# Patient Record
Sex: Male | Born: 1970 | Race: White | Hispanic: No | Marital: Married | Smoking: Current every day smoker
Health system: Southern US, Community
[De-identification: ages and names within clinical notes are randomized; demographics above are authoritative.]

## PROBLEM LIST (undated history)

## (undated) DIAGNOSIS — I219 Acute myocardial infarction, unspecified: Secondary | ICD-10-CM

## (undated) DIAGNOSIS — E119 Type 2 diabetes mellitus without complications: Secondary | ICD-10-CM

## (undated) DIAGNOSIS — R569 Unspecified convulsions: Secondary | ICD-10-CM

## (undated) HISTORY — PX: CARDIAC SURGERY: SHX584

---

## 1998-03-08 ENCOUNTER — Emergency Department (HOSPITAL_COMMUNITY): Admission: EM | Admit: 1998-03-08 | Discharge: 1998-03-08 | Payer: Self-pay | Admitting: Emergency Medicine

## 1998-05-12 ENCOUNTER — Emergency Department (HOSPITAL_COMMUNITY): Admission: EM | Admit: 1998-05-12 | Discharge: 1998-05-12 | Payer: Self-pay | Admitting: Emergency Medicine

## 1998-08-30 ENCOUNTER — Encounter (HOSPITAL_COMMUNITY): Admission: RE | Admit: 1998-08-30 | Discharge: 1998-11-28 | Payer: Self-pay | Admitting: Neurology

## 1998-11-27 ENCOUNTER — Emergency Department (HOSPITAL_COMMUNITY): Admission: EM | Admit: 1998-11-27 | Discharge: 1998-11-27 | Payer: Self-pay | Admitting: Emergency Medicine

## 1998-12-14 ENCOUNTER — Encounter: Payer: Self-pay | Admitting: Endocrinology

## 1998-12-14 ENCOUNTER — Ambulatory Visit (HOSPITAL_COMMUNITY): Admission: RE | Admit: 1998-12-14 | Discharge: 1998-12-14 | Payer: Self-pay | Admitting: Endocrinology

## 1999-04-09 ENCOUNTER — Emergency Department (HOSPITAL_COMMUNITY): Admission: EM | Admit: 1999-04-09 | Discharge: 1999-04-09 | Payer: Self-pay

## 1999-07-29 ENCOUNTER — Encounter (INDEPENDENT_AMBULATORY_CARE_PROVIDER_SITE_OTHER): Payer: Self-pay | Admitting: *Deleted

## 1999-07-29 ENCOUNTER — Inpatient Hospital Stay (HOSPITAL_COMMUNITY): Admission: EM | Admit: 1999-07-29 | Discharge: 1999-07-31 | Payer: Self-pay | Admitting: *Deleted

## 1999-09-07 ENCOUNTER — Encounter: Admission: RE | Admit: 1999-09-07 | Discharge: 1999-09-07 | Payer: Self-pay | Admitting: Infectious Diseases

## 2000-03-19 ENCOUNTER — Encounter (INDEPENDENT_AMBULATORY_CARE_PROVIDER_SITE_OTHER): Payer: Self-pay | Admitting: Specialist

## 2000-03-19 ENCOUNTER — Ambulatory Visit (HOSPITAL_COMMUNITY): Admission: RE | Admit: 2000-03-19 | Discharge: 2000-03-19 | Payer: Self-pay | Admitting: Gastroenterology

## 2000-05-26 ENCOUNTER — Encounter: Payer: Self-pay | Admitting: Emergency Medicine

## 2000-05-27 ENCOUNTER — Inpatient Hospital Stay (HOSPITAL_COMMUNITY): Admission: EM | Admit: 2000-05-27 | Discharge: 2000-06-02 | Payer: Self-pay | Admitting: Emergency Medicine

## 2000-05-27 ENCOUNTER — Encounter: Payer: Self-pay | Admitting: Pulmonary Disease

## 2000-05-28 ENCOUNTER — Encounter: Payer: Self-pay | Admitting: Neurology

## 2000-05-29 ENCOUNTER — Encounter: Payer: Self-pay | Admitting: Neurology

## 2000-08-14 ENCOUNTER — Ambulatory Visit (HOSPITAL_COMMUNITY): Admission: RE | Admit: 2000-08-14 | Discharge: 2000-08-14 | Payer: Self-pay | Admitting: Family Medicine

## 2000-08-14 ENCOUNTER — Encounter: Payer: Self-pay | Admitting: Family Medicine

## 2000-10-13 ENCOUNTER — Encounter: Payer: Self-pay | Admitting: Emergency Medicine

## 2000-10-13 ENCOUNTER — Emergency Department (HOSPITAL_COMMUNITY): Admission: EM | Admit: 2000-10-13 | Discharge: 2000-10-13 | Payer: Self-pay | Admitting: Emergency Medicine

## 2000-12-09 ENCOUNTER — Emergency Department (HOSPITAL_COMMUNITY): Admission: EM | Admit: 2000-12-09 | Discharge: 2000-12-09 | Payer: Self-pay | Admitting: Emergency Medicine

## 2000-12-09 ENCOUNTER — Encounter: Payer: Self-pay | Admitting: Emergency Medicine

## 2001-03-27 ENCOUNTER — Emergency Department (HOSPITAL_COMMUNITY): Admission: EM | Admit: 2001-03-27 | Discharge: 2001-03-27 | Payer: Self-pay | Admitting: Emergency Medicine

## 2001-03-27 ENCOUNTER — Encounter: Payer: Self-pay | Admitting: Emergency Medicine

## 2001-07-05 ENCOUNTER — Encounter: Payer: Self-pay | Admitting: Emergency Medicine

## 2001-07-05 ENCOUNTER — Emergency Department (HOSPITAL_COMMUNITY): Admission: EM | Admit: 2001-07-05 | Discharge: 2001-07-05 | Payer: Self-pay | Admitting: Emergency Medicine

## 2001-07-14 ENCOUNTER — Emergency Department (HOSPITAL_COMMUNITY): Admission: EM | Admit: 2001-07-14 | Discharge: 2001-07-14 | Payer: Self-pay

## 2001-10-04 ENCOUNTER — Emergency Department (HOSPITAL_COMMUNITY): Admission: EM | Admit: 2001-10-04 | Discharge: 2001-10-04 | Payer: Self-pay | Admitting: Emergency Medicine

## 2001-10-04 ENCOUNTER — Encounter: Payer: Self-pay | Admitting: Emergency Medicine

## 2002-01-09 ENCOUNTER — Encounter: Payer: Self-pay | Admitting: Emergency Medicine

## 2002-01-09 ENCOUNTER — Emergency Department (HOSPITAL_COMMUNITY): Admission: EM | Admit: 2002-01-09 | Discharge: 2002-01-09 | Payer: Self-pay | Admitting: Emergency Medicine

## 2002-11-27 ENCOUNTER — Emergency Department (HOSPITAL_COMMUNITY): Admission: EM | Admit: 2002-11-27 | Discharge: 2002-11-27 | Payer: Self-pay | Admitting: Emergency Medicine

## 2002-11-27 ENCOUNTER — Encounter: Payer: Self-pay | Admitting: Emergency Medicine

## 2003-05-08 ENCOUNTER — Emergency Department (HOSPITAL_COMMUNITY): Admission: EM | Admit: 2003-05-08 | Discharge: 2003-05-08 | Payer: Self-pay | Admitting: Emergency Medicine

## 2003-10-17 ENCOUNTER — Emergency Department (HOSPITAL_COMMUNITY): Admission: EM | Admit: 2003-10-17 | Discharge: 2003-10-17 | Payer: Self-pay

## 2003-11-20 ENCOUNTER — Emergency Department (HOSPITAL_COMMUNITY): Admission: EM | Admit: 2003-11-20 | Discharge: 2003-11-20 | Payer: Self-pay | Admitting: Emergency Medicine

## 2004-01-17 ENCOUNTER — Emergency Department (HOSPITAL_COMMUNITY): Admission: EM | Admit: 2004-01-17 | Discharge: 2004-01-17 | Payer: Self-pay | Admitting: Emergency Medicine

## 2004-01-25 ENCOUNTER — Emergency Department (HOSPITAL_COMMUNITY): Admission: EM | Admit: 2004-01-25 | Discharge: 2004-01-25 | Payer: Self-pay | Admitting: Family Medicine

## 2004-02-10 ENCOUNTER — Emergency Department (HOSPITAL_COMMUNITY): Admission: EM | Admit: 2004-02-10 | Discharge: 2004-02-10 | Payer: Self-pay | Admitting: Family Medicine

## 2004-04-12 ENCOUNTER — Encounter: Admission: RE | Admit: 2004-04-12 | Discharge: 2004-04-12 | Payer: Self-pay | Admitting: Family Medicine

## 2004-04-12 ENCOUNTER — Emergency Department (HOSPITAL_COMMUNITY): Admission: EM | Admit: 2004-04-12 | Discharge: 2004-04-12 | Payer: Self-pay | Admitting: Emergency Medicine

## 2004-07-10 ENCOUNTER — Emergency Department (HOSPITAL_COMMUNITY): Admission: EM | Admit: 2004-07-10 | Discharge: 2004-07-10 | Payer: Self-pay | Admitting: Family Medicine

## 2004-08-23 ENCOUNTER — Emergency Department (HOSPITAL_COMMUNITY): Admission: EM | Admit: 2004-08-23 | Discharge: 2004-08-23 | Payer: Self-pay | Admitting: Family Medicine

## 2004-12-09 ENCOUNTER — Emergency Department (HOSPITAL_COMMUNITY): Admission: EM | Admit: 2004-12-09 | Discharge: 2004-12-09 | Payer: Self-pay | Admitting: Emergency Medicine

## 2004-12-25 ENCOUNTER — Emergency Department (HOSPITAL_COMMUNITY): Admission: EM | Admit: 2004-12-25 | Discharge: 2004-12-25 | Payer: Self-pay | Admitting: Family Medicine

## 2005-01-21 ENCOUNTER — Encounter: Admission: RE | Admit: 2005-01-21 | Discharge: 2005-01-21 | Payer: Self-pay | Admitting: Family Medicine

## 2005-02-11 ENCOUNTER — Emergency Department (HOSPITAL_COMMUNITY): Admission: EM | Admit: 2005-02-11 | Discharge: 2005-02-11 | Payer: Self-pay | Admitting: Emergency Medicine

## 2005-02-22 ENCOUNTER — Emergency Department: Payer: Self-pay | Admitting: Emergency Medicine

## 2005-03-02 ENCOUNTER — Emergency Department (HOSPITAL_COMMUNITY): Admission: EM | Admit: 2005-03-02 | Discharge: 2005-03-02 | Payer: Self-pay | Admitting: Emergency Medicine

## 2005-03-14 ENCOUNTER — Emergency Department (HOSPITAL_COMMUNITY): Admission: EM | Admit: 2005-03-14 | Discharge: 2005-03-15 | Payer: Self-pay | Admitting: Emergency Medicine

## 2005-04-16 ENCOUNTER — Emergency Department: Payer: Self-pay | Admitting: Unknown Physician Specialty

## 2005-06-10 ENCOUNTER — Emergency Department: Payer: Self-pay | Admitting: General Practice

## 2005-06-11 ENCOUNTER — Emergency Department (HOSPITAL_COMMUNITY): Admission: EM | Admit: 2005-06-11 | Discharge: 2005-06-11 | Payer: Self-pay | Admitting: Emergency Medicine

## 2005-08-10 ENCOUNTER — Emergency Department: Payer: Self-pay | Admitting: Emergency Medicine

## 2005-09-22 ENCOUNTER — Encounter: Admission: RE | Admit: 2005-09-22 | Discharge: 2005-09-22 | Payer: Self-pay | Admitting: *Deleted

## 2005-10-27 ENCOUNTER — Emergency Department (HOSPITAL_COMMUNITY): Admission: EM | Admit: 2005-10-27 | Discharge: 2005-10-27 | Payer: Self-pay | Admitting: Emergency Medicine

## 2005-12-03 ENCOUNTER — Ambulatory Visit: Payer: Self-pay | Admitting: Family Medicine

## 2005-12-16 IMAGING — CR DG CHEST 2V
1 series · 2 of 2 positions shown · non-contrast
Comparison: none

REASON FOR EXAM: Cough
COMMENTS:  LMP: (Male)

PROCEDURE:     DXR - DXR CHEST PA (OR AP) AND LATERAL  - April 16, 2005  [DATE]
RESULT:     The inspiratory effort is somewhat shallow.  The lungs are
clear.  The heart and pulmonary vessels are normal.

[Series 2134: postero_anterior · 0.11mm/px · 2 of 2 slices shown]
[im 1/2]
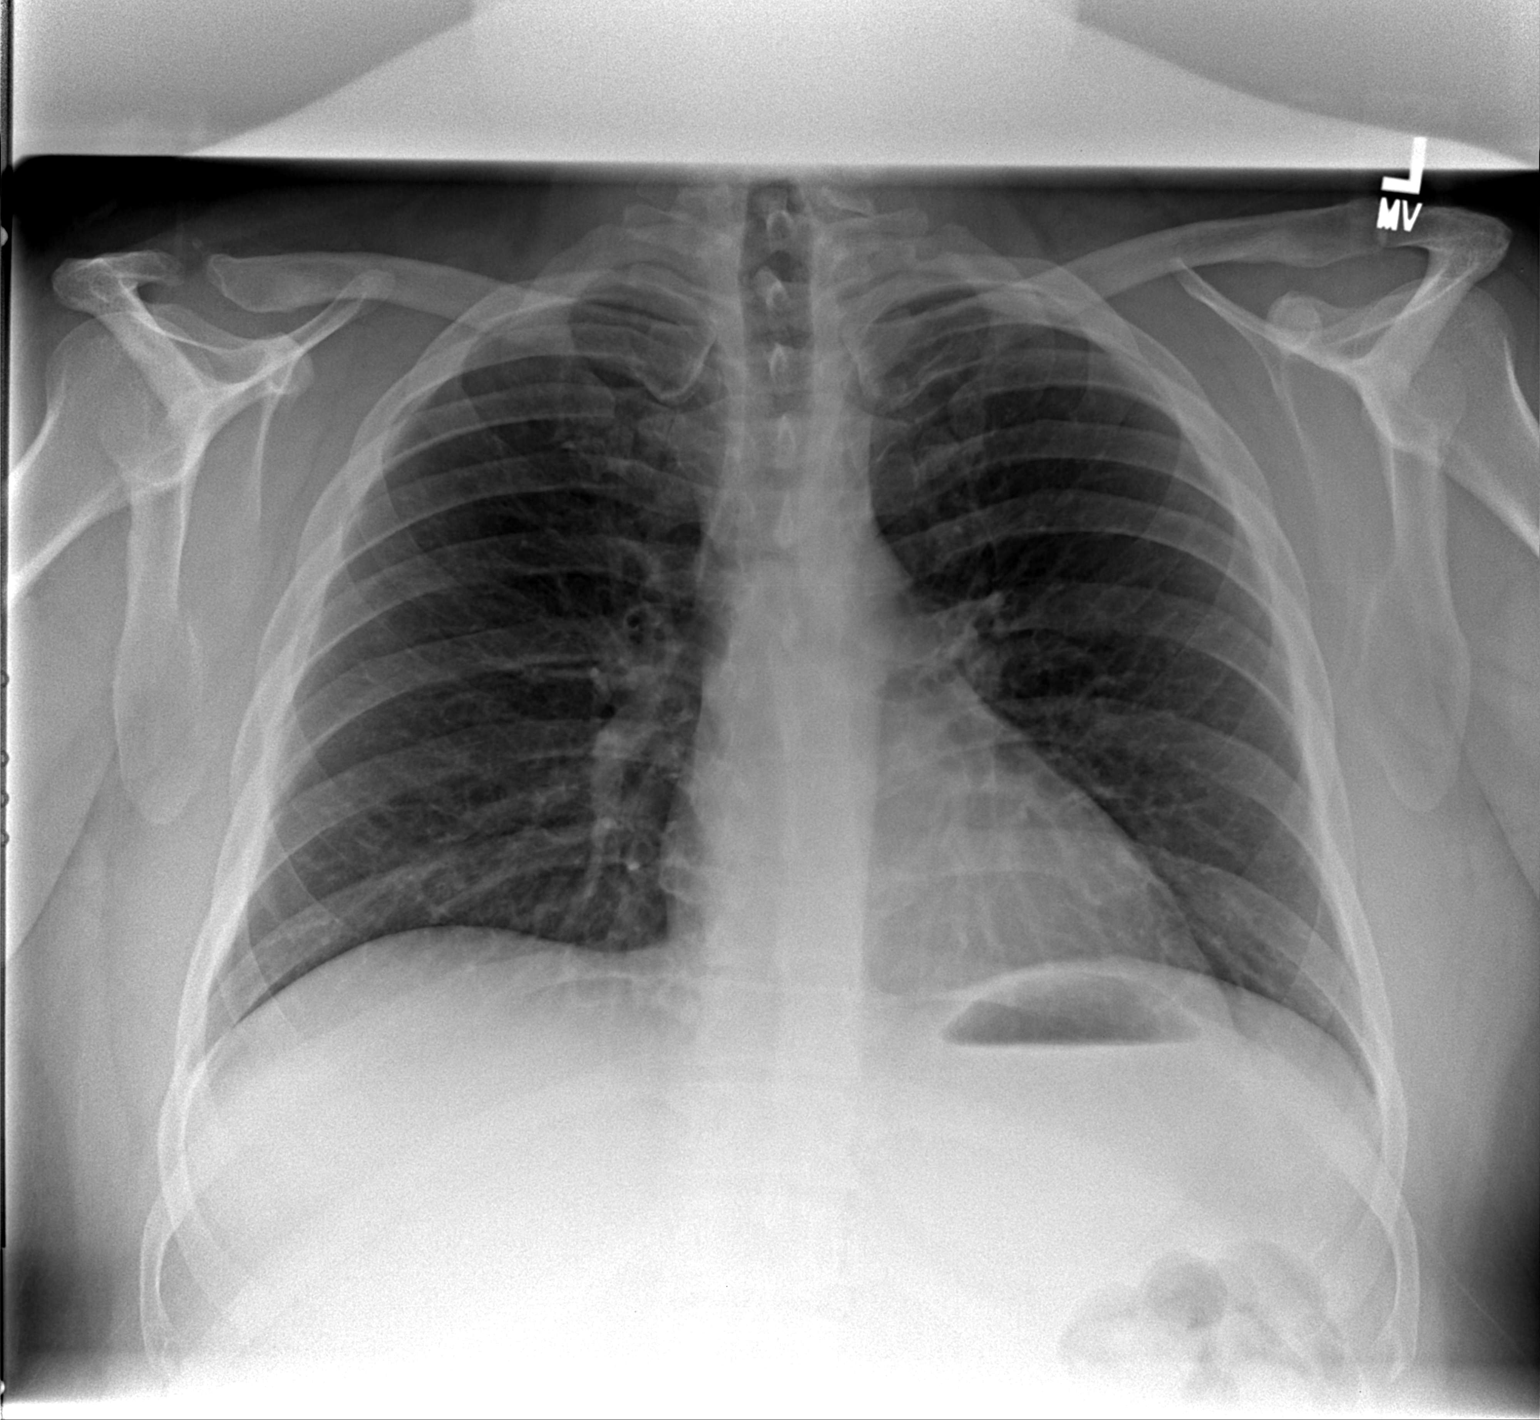
[im 2/2]
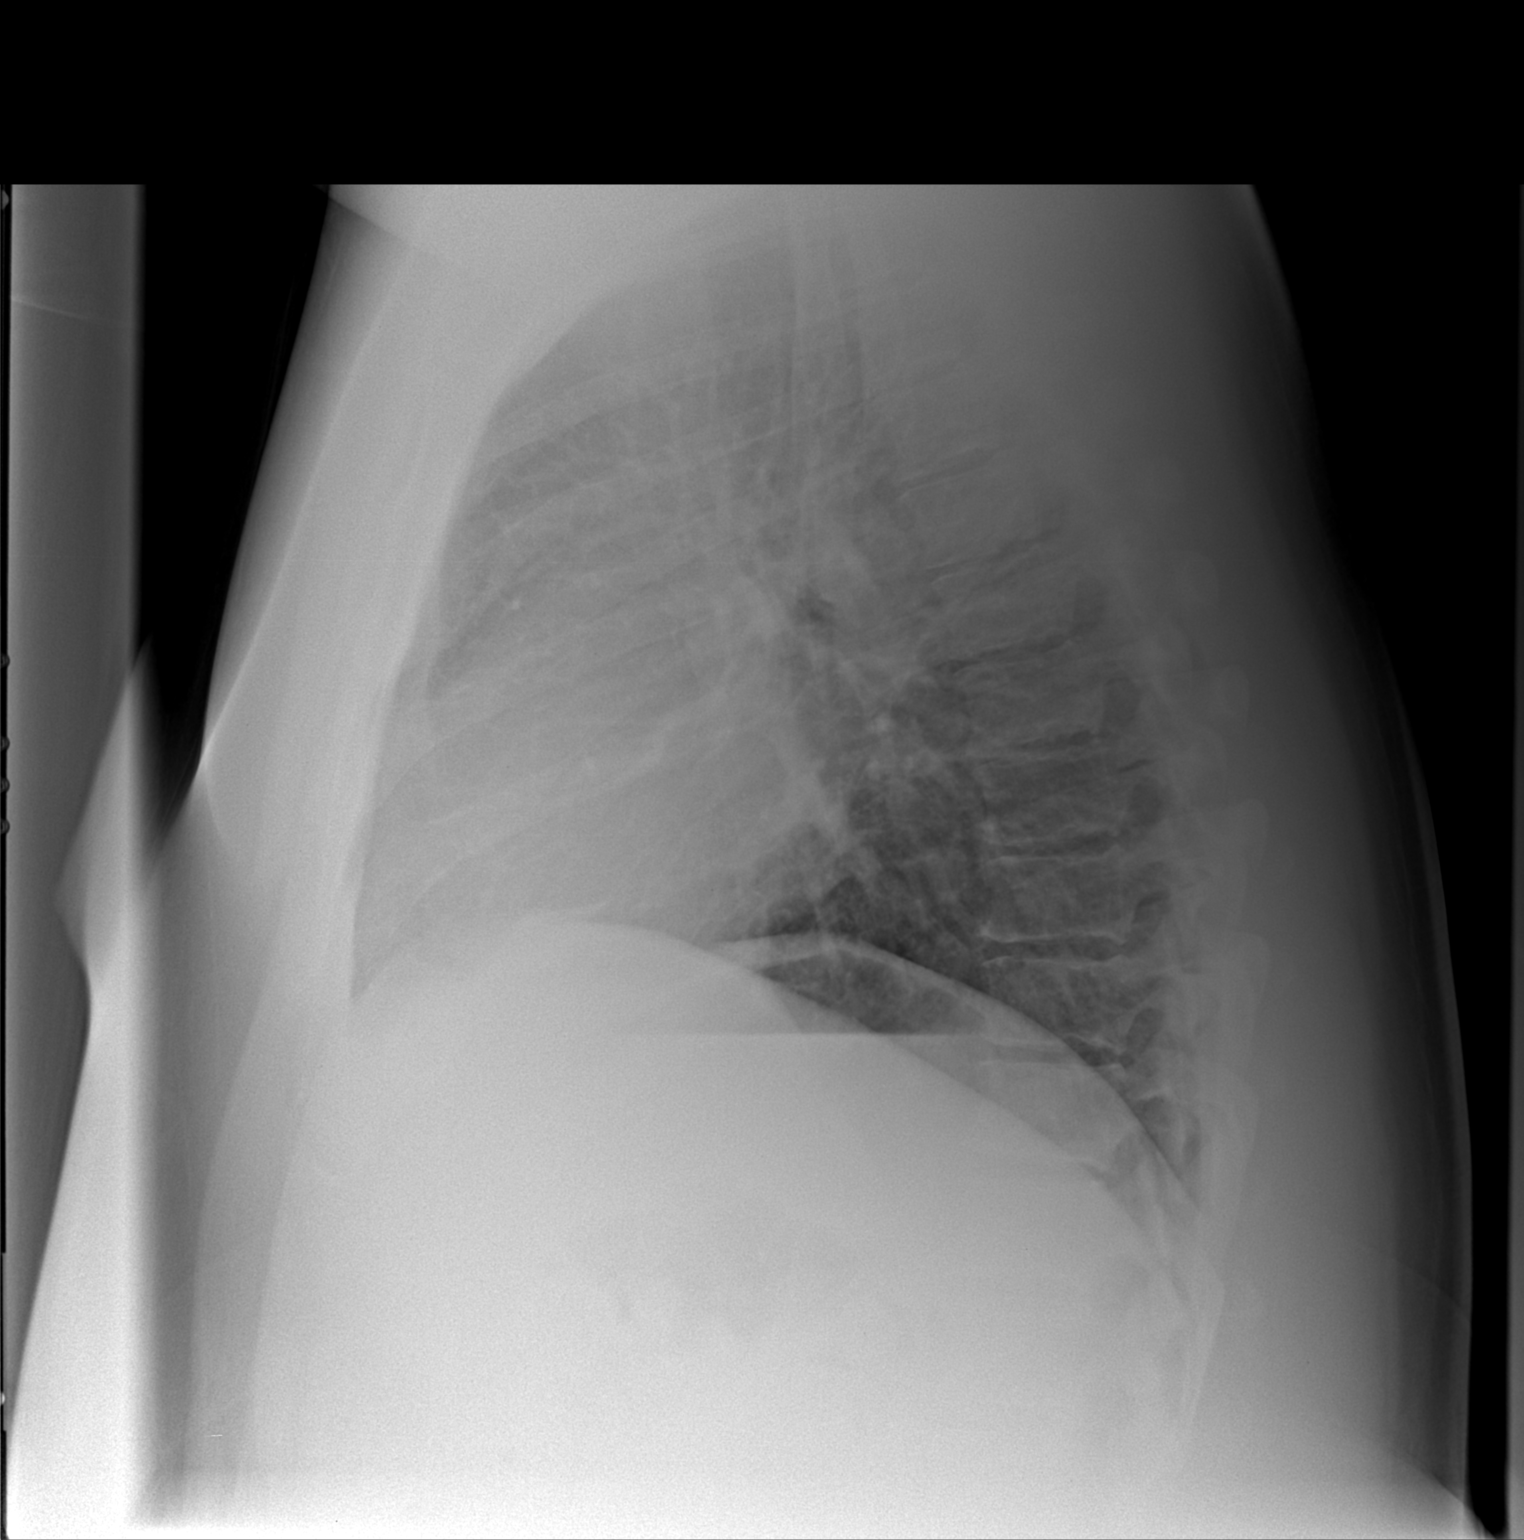

[2 of 2 positions shown; findings below may reference images not displayed]

IMPRESSION: No acute abnormality.

Shallow inspiration.

## 2006-01-19 ENCOUNTER — Emergency Department (HOSPITAL_COMMUNITY): Admission: EM | Admit: 2006-01-19 | Discharge: 2006-01-19 | Payer: Self-pay | Admitting: Emergency Medicine

## 2006-02-02 ENCOUNTER — Emergency Department: Payer: Self-pay | Admitting: Emergency Medicine

## 2006-05-16 ENCOUNTER — Ambulatory Visit: Payer: Self-pay | Admitting: Family Medicine

## 2006-06-12 ENCOUNTER — Encounter: Admission: RE | Admit: 2006-06-12 | Discharge: 2006-06-12 | Payer: Self-pay | Admitting: Family Medicine

## 2006-07-01 ENCOUNTER — Ambulatory Visit: Payer: Self-pay | Admitting: Family Medicine

## 2006-08-10 ENCOUNTER — Emergency Department: Payer: Self-pay | Admitting: Emergency Medicine

## 2006-08-28 ENCOUNTER — Emergency Department (HOSPITAL_COMMUNITY): Admission: EM | Admit: 2006-08-28 | Discharge: 2006-08-28 | Payer: Self-pay | Admitting: Emergency Medicine

## 2006-10-04 IMAGING — CR DG CHEST 2V
1 series · 2 of 2 positions shown · non-contrast
Comparison: none

REASON FOR EXAM: COUGH
COMMENTS:

PROCEDURE:     DXR - DXR CHEST PA (OR AP) AND LATERAL  - February 03, 2006 [DATE]
RESULT:     Comparison is made to the study of 04-26-05.  The lungs are
clear. The heart and pulmonary vessels are normal. The bony and mediastinal
structures are unremarkable.

[Series 1: view not recorded · 0.17mm/px · 2 of 2 slices shown]
[im 1/2]
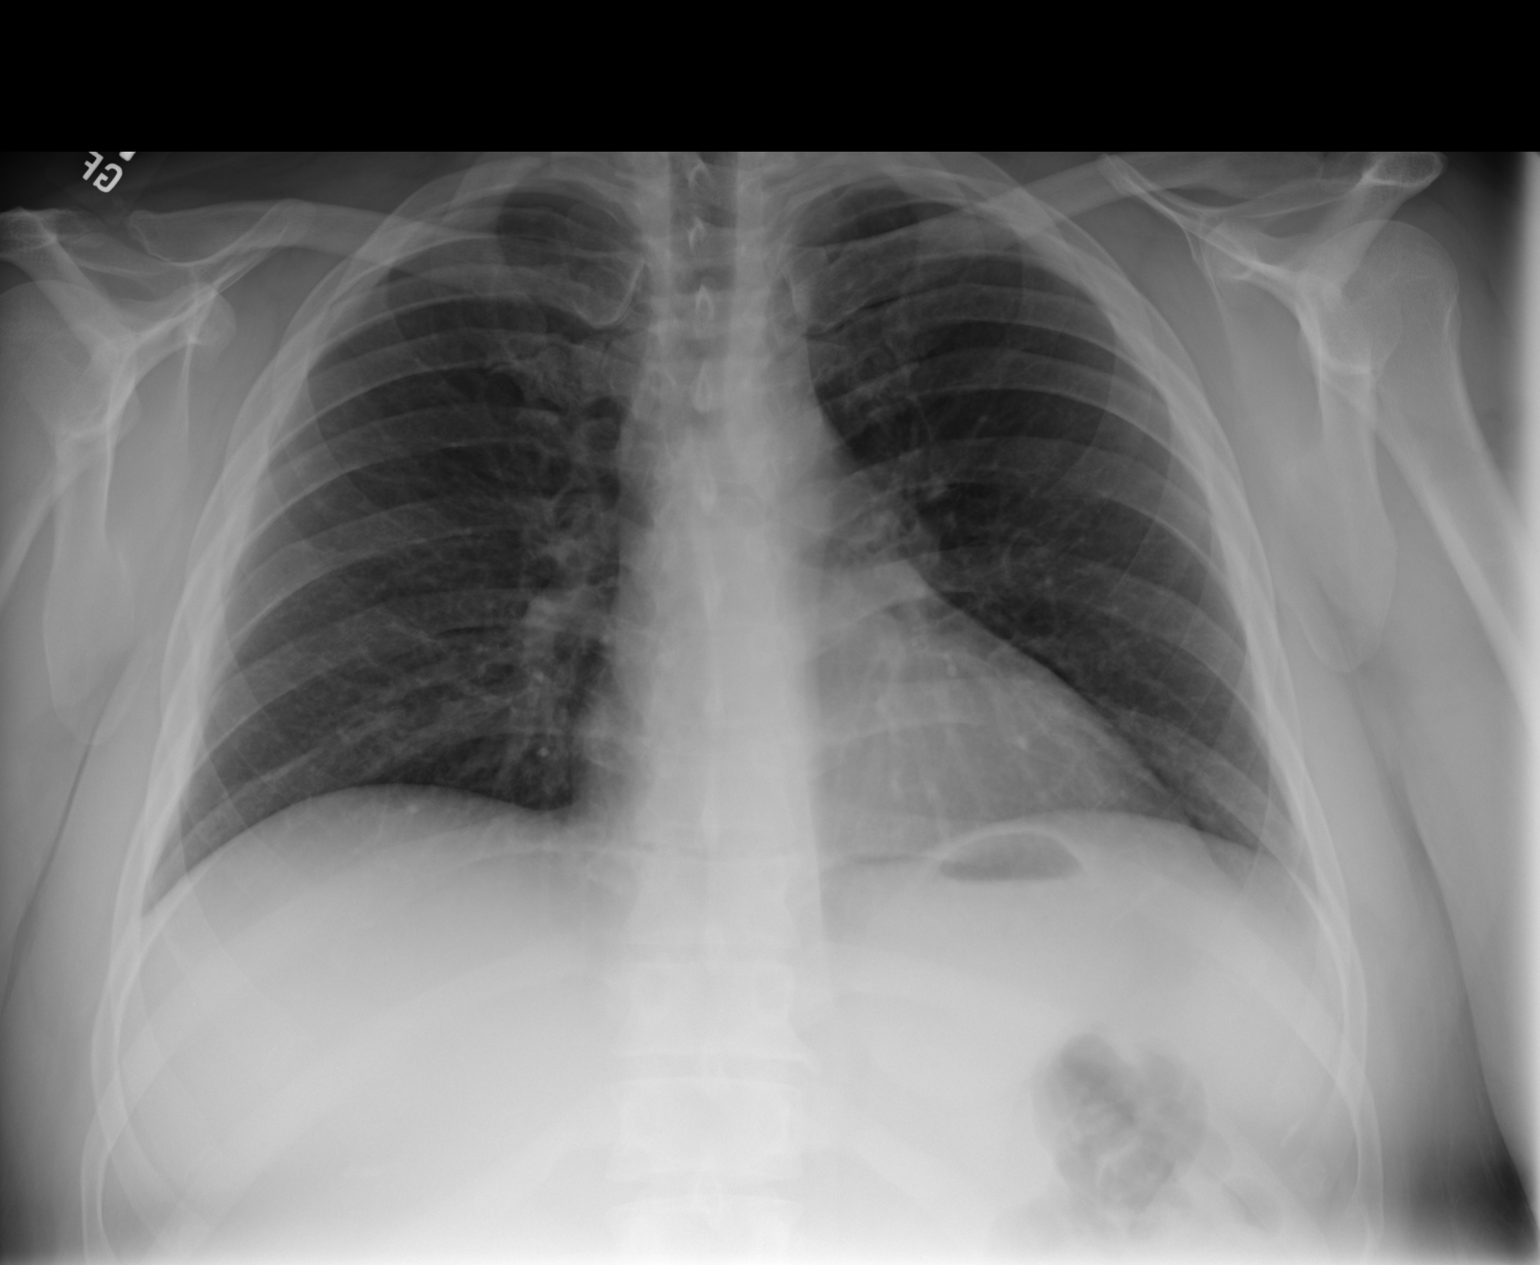
[im 2/2]
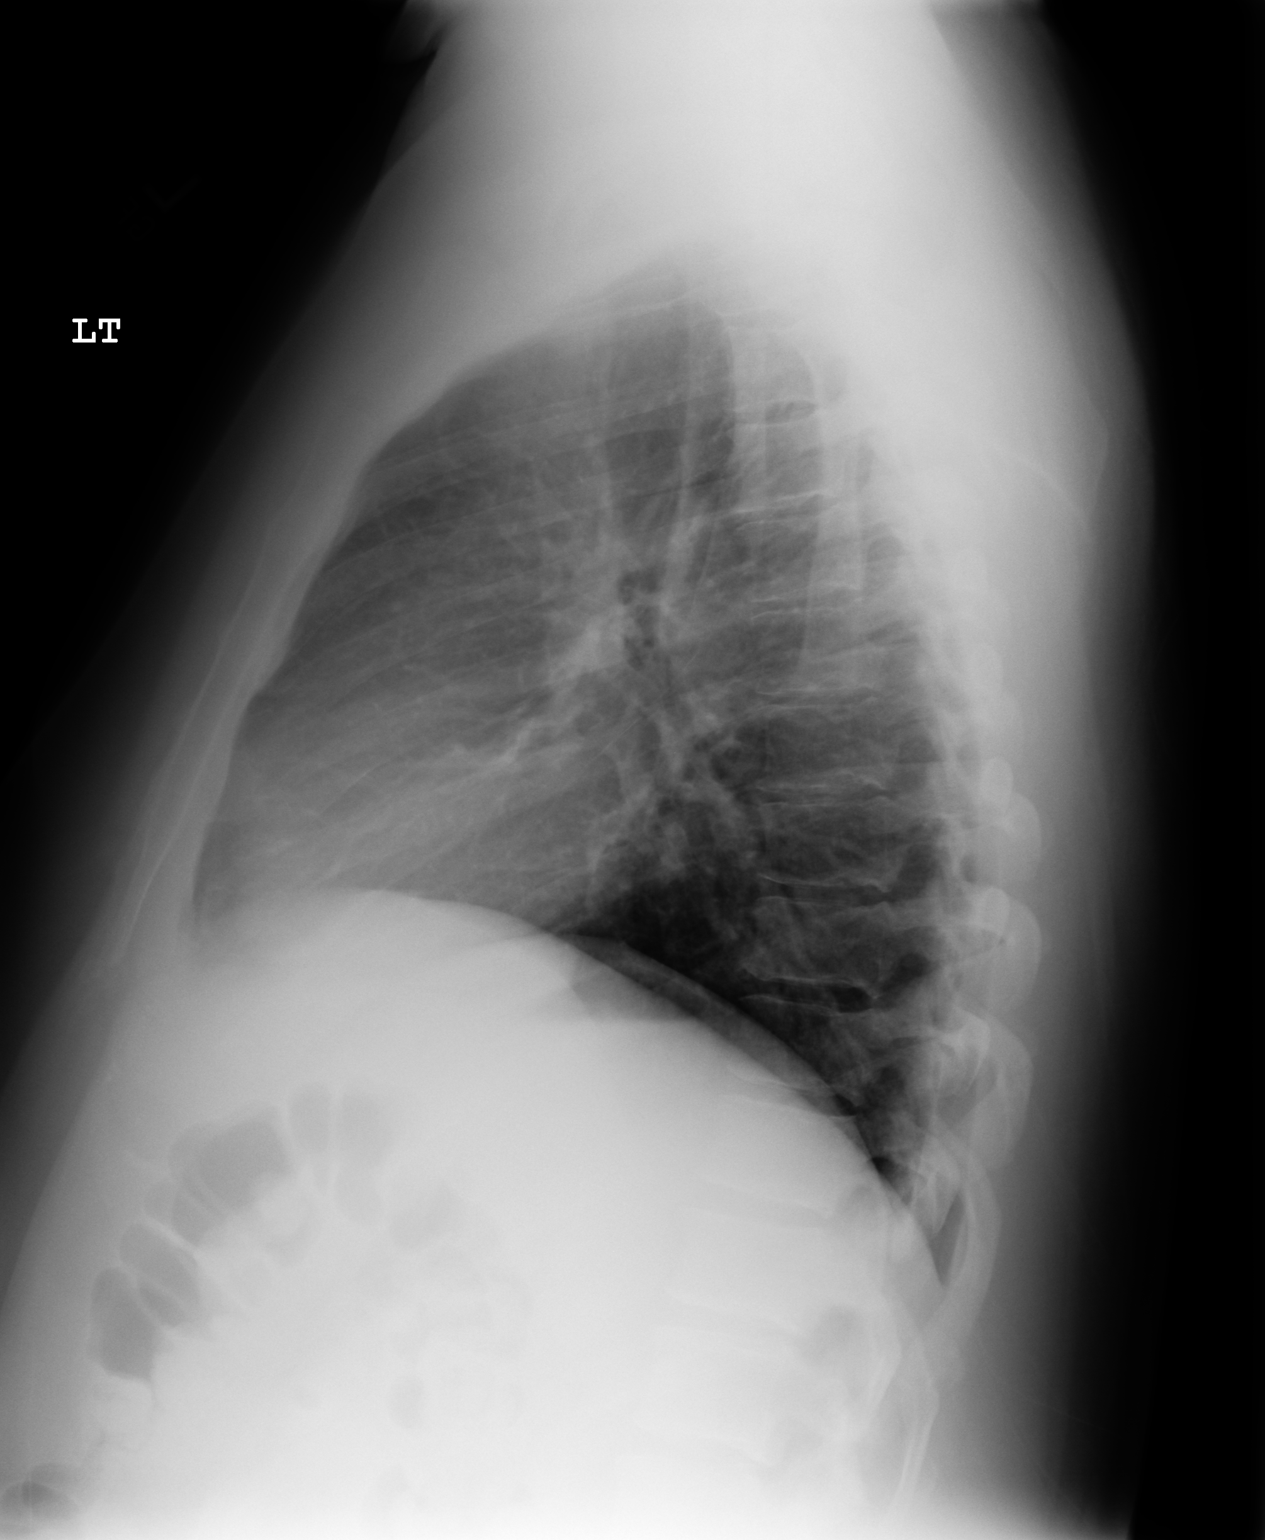

[2 of 2 positions shown; findings below may reference images not displayed]

IMPRESSION: 1)No acute cardiopulmonary disease evident.

## 2006-10-10 ENCOUNTER — Emergency Department (HOSPITAL_COMMUNITY): Admission: EM | Admit: 2006-10-10 | Discharge: 2006-10-10 | Payer: Self-pay | Admitting: Emergency Medicine

## 2006-10-13 ENCOUNTER — Encounter: Admission: RE | Admit: 2006-10-13 | Discharge: 2006-10-13 | Payer: Self-pay | Admitting: Neurology

## 2006-10-16 ENCOUNTER — Ambulatory Visit (HOSPITAL_COMMUNITY): Admission: RE | Admit: 2006-10-16 | Discharge: 2006-10-16 | Payer: Self-pay | Admitting: Neurology

## 2006-11-01 ENCOUNTER — Emergency Department (HOSPITAL_COMMUNITY): Admission: EM | Admit: 2006-11-01 | Discharge: 2006-11-01 | Payer: Self-pay | Admitting: Emergency Medicine

## 2008-09-30 IMAGING — CT CT HEAD W/O CM
2 series · 16 of 30 positions shown, 20 images · non-contrast
Comparison: NONE

CLINICAL DATA: Headache and confusion and previous history of 
lacunar  infarctions. 

CT OF THE HEAD WITHOUT INTRAVENOUS CONTRAST
TECHNIQUE: Axial 5 millimeter thick slices were obtained through 
the posterior fossa and 5 millimeter thick slices were obtained 
through the remaining portion of the head without intravenous 
contrast.

[Series 2: without contrast · axial · non-contrast · 0.45mm/px · z∈[+41,+181]mm · 13 of 34 slices shown, 17 images]
[im 3/34  brain]
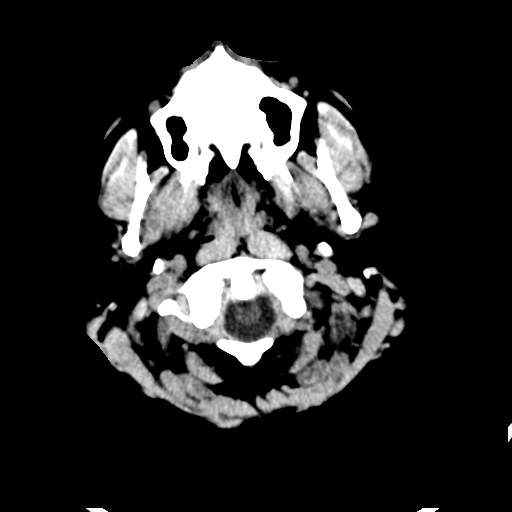
[im 3/34  bone]
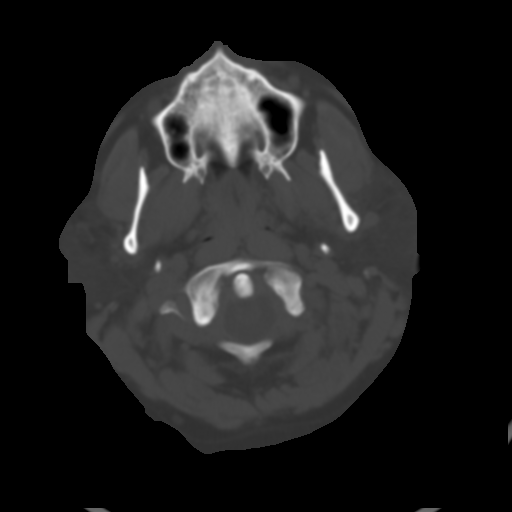
[im 5/34  brain]
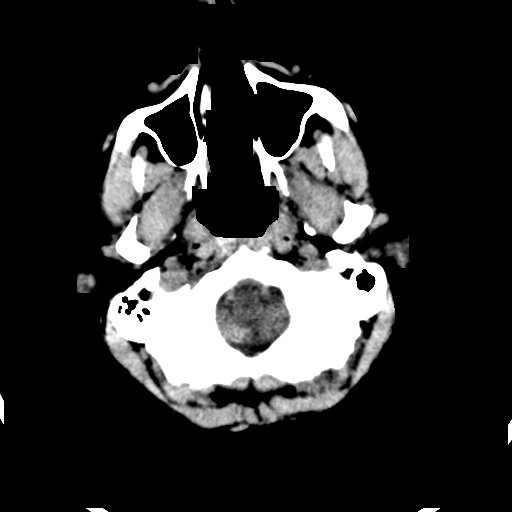
[im 8/34  brain]
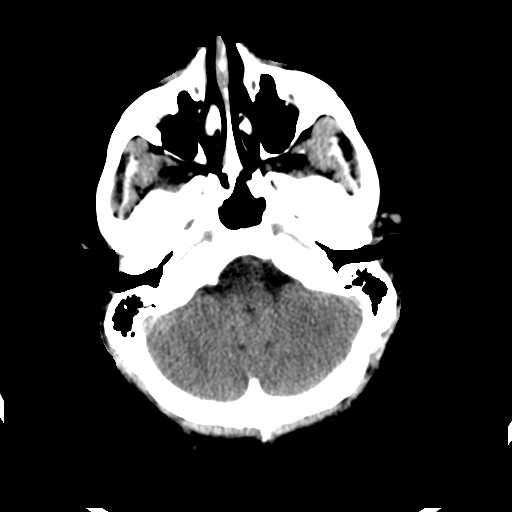
[im 10/34  brain]
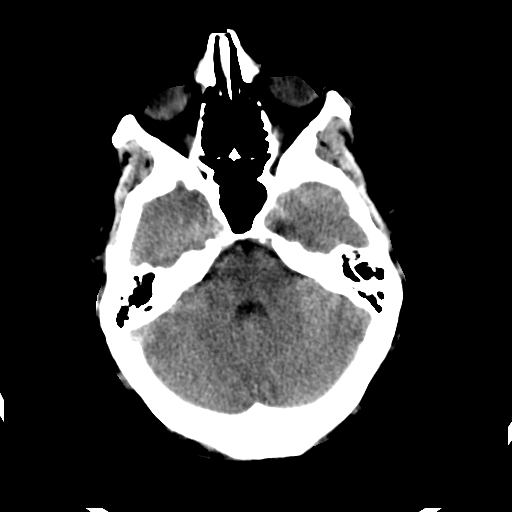
[im 12/34  brain]
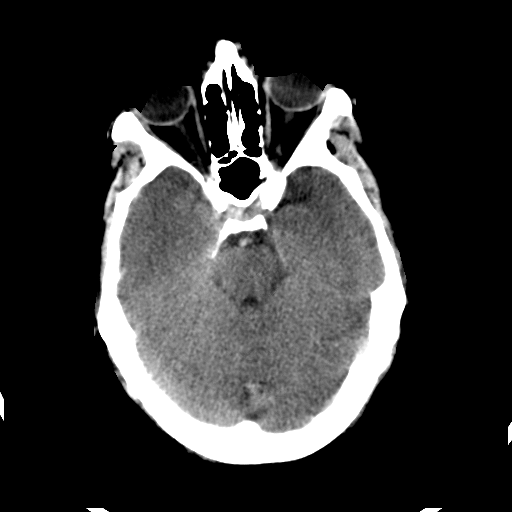
[im 12/34  bone]
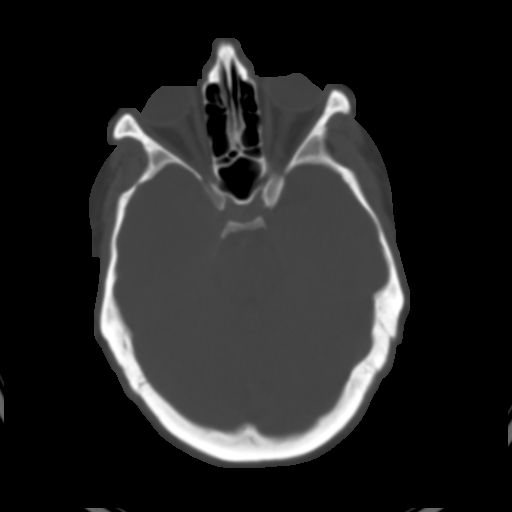
[im 15/34  brain]
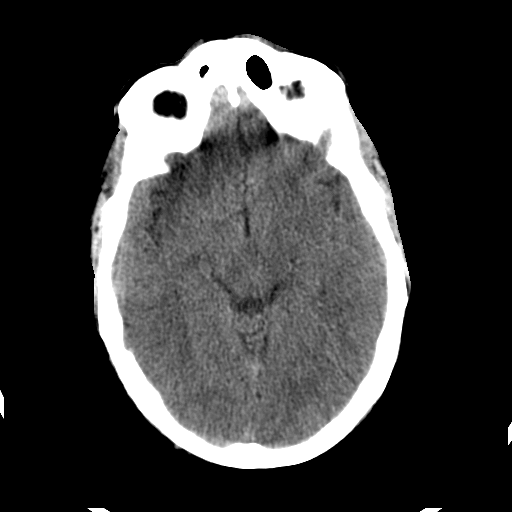
[im 17/34  brain]
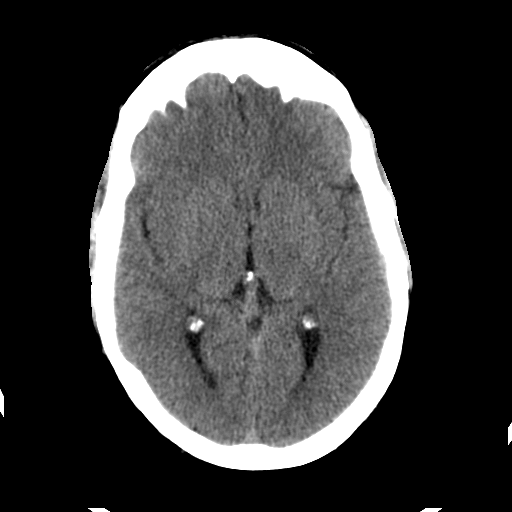
[im 19/34  brain]
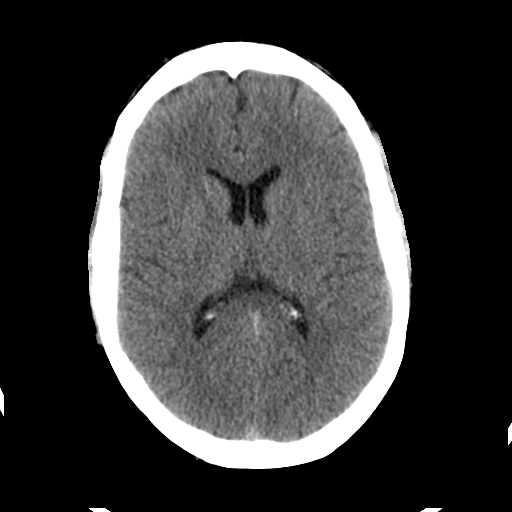
[im 22/34  brain]
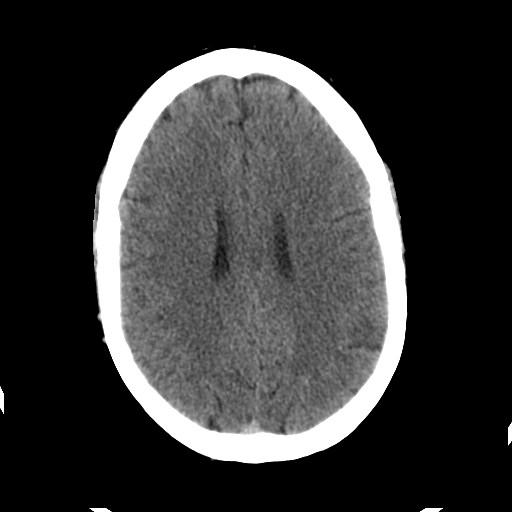
[im 22/34  bone]
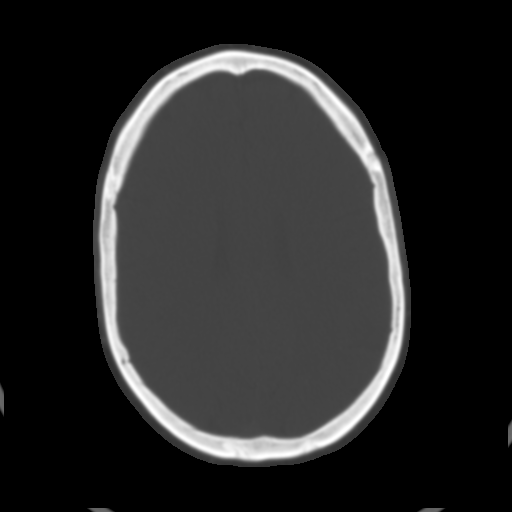
[im 24/34  brain]
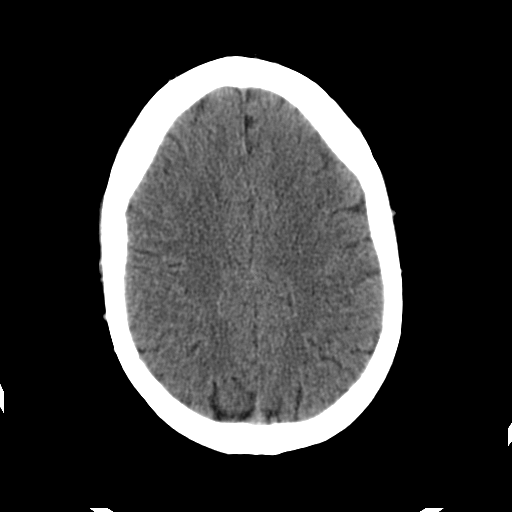
[im 26/34  brain]
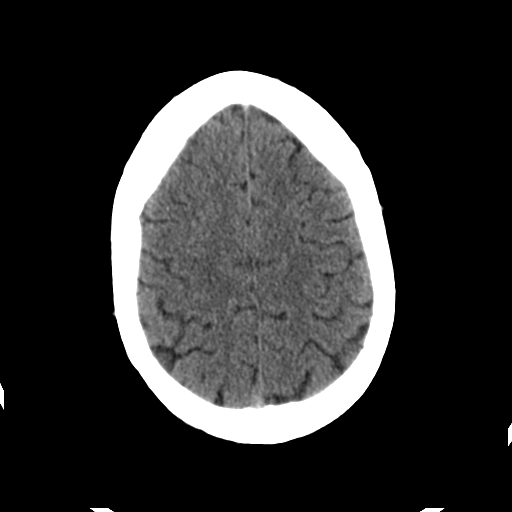
[im 29/34  brain]
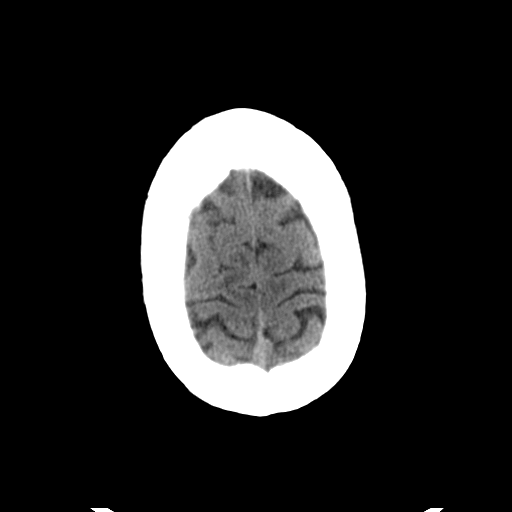
[im 31/34  brain]
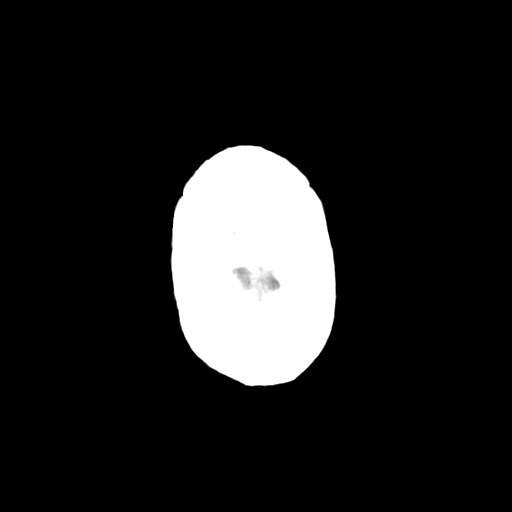
[im 31/34  bone]
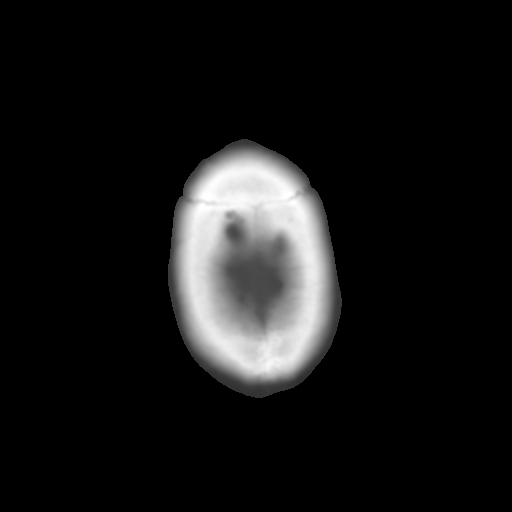

[Series 3: bone windows · axial · 0.45mm/px · z∈[+41,+86]mm · 3 of 34 slices shown]
[im 3/34  bone]
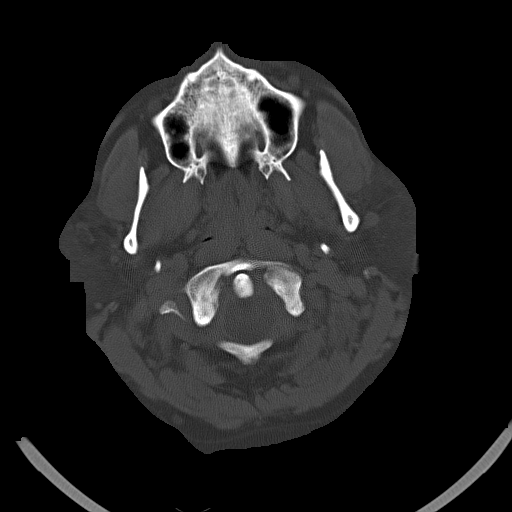
[im 8/34  bone]
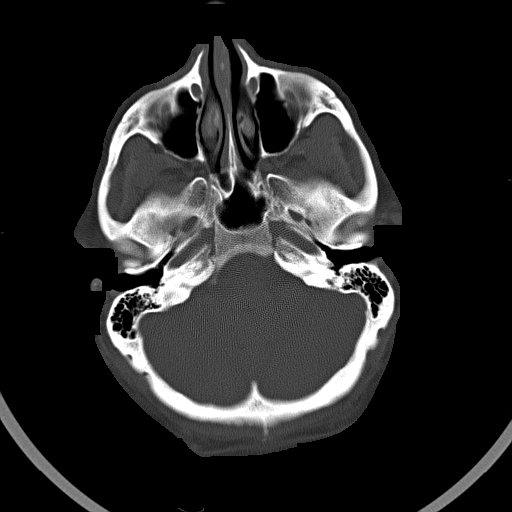
[im 12/34  bone]
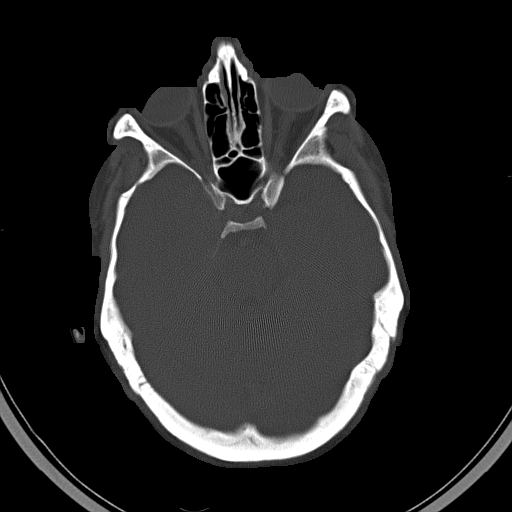

[16 of 30 positions shown; findings below may reference images not displayed]

FINDINGS: There is no evidence of hemorrhage or midline shift.  No 
extra-axial fluid collections are seen.  The ventricles, cisterns, 
and sulci are within normal limits.  No evidence of air-fluid 
levels in the paranasal sinuses.  No opacification of the mastoid 
processes or paranasal sinuses.
IMPRESSION: 1.  Head CT scan is within normal limits. Pi Ebner

## 2010-06-30 ENCOUNTER — Encounter: Payer: Self-pay | Admitting: Family Medicine

## 2010-07-01 ENCOUNTER — Encounter: Payer: Self-pay | Admitting: Family Medicine

## 2010-10-23 NOTE — Procedures (Signed)
EEG NUMBER:  [id number].   CLINICAL HISTORY:  The patient is a 40 year old with a history positive  stroke versus cerebral hemorrhage.  The patient has severe headache.  Study is being done to look for the presence of focal neurologic  deficit. 434.91   PROCEDURE:  The tracing is carried out on a 32-channel digital Cadwell  recorder re-formatted into 16-channel montages with one devoted to EKG.  The patient was awake and asleep during the recording.  The  [employer] 10/20 system lead placement was used.   MEDICATIONS:  Include Klonopin, OxyContin, Lyrica and Lamictal.   DESCRIPTION OF FINDINGS:  Dominant frequency is a 10-30 microvolt 10-Hz  activity that is well regulated.  The patient becomes drowsy with mixed  frequency rhythmic upper theta range activity.  The patient drifts into  natural sleep with vertex sharp waves and symmetric and synchronous  sleep spindles, and shifts between drowsiness and natural sleep, for  much of the record.   Photic stimulation induced a driving response between 11 and 17 Hz.  Hyperventilation caused no significant change.  There was no interictal  epileptiform activity in the form of spikes or sharp waves.   IMPRESSION:  In the waking state, drowsiness and light natural sleep.  This record is normal.      Kenneth Jones. Sharene Skeans, M.D.  Electronically Signed     [id number]  D:  10/16/2006 17:01:22  T:  10/16/2006 22:17:15  Job #:  [id number]   cc:   Rene Kocher, M.D.  Fax: [phone]

## 2010-10-26 NOTE — Discharge Summary (Signed)
[facility]  Patient:    Kenneth Jones, Kenneth Jones                      MRN: [id number] Adm. Date:  [id number] Disc. Date: [id number] Attending:  Anastasio Auerbach CC:         Marinda Elk, M.D.  Aundra Dubin, M.D.  Catherine A. Orlin Hilding, M.D.  Armanda Magic, M.D.  Fransisco Hertz, M.D.   Discharge Summary  DATE OF BIRTH:  12-14-1970  DISCHARGE DIAGNOSES:  1. Probable new onset seizure.     A. Postictal state:  Required intubation to protect airway.        1. Extubated the following morning.     B. Electroencephalogram no evidence of seizure focus x 2.     C. Resolution of symptoms with addition of Dilantin.  2. 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     A. Angiogram:  Decreased profusion left anterior cerebral artery.     B. Transesophageal echocardiogram:  No intracardiac source of emboli.     C. Question ischemia versus toxic or metabolic insult.  3. Episodes of dystonia, resolved.  4. Mildly diminished left ventricular function.     A. Ejection fraction 40-45%.     B. Etiology unclear.  5. External carotid artery hyperemia.     A. Left greater than right nasopharyngeal area.     B. Limited computed tomography of sinuses negative.  6. Chronic fever and polyarthralgias.     A. Ongoing work-up for the past 1-1/2 years (Drs. Kellie Simmering, Noel Journey).     B. Elevated sedimentation rate.     C. On chronic narcotic therapy.  7. Hyperglycemia.     A. Glycosylated hemoglobin normal this admission (5.6%).  8. Depression.  9. Anxiety. 10. Gastroesophageal reflux disease. 11. History of migraines. 12. History of head trauma as a child without residual effect. 13. ALLERGY to CODEINE. 14. Recent right ear pain and fullness.     A. Mild fullness persists.     B. No visual abnormality in the tympanic membrane.  DISCHARGE MEDICATIONS: 1. Celexa 40 mg q.d. 2. (New) Ativan 1 mg 1/2 tab q.8h. p.r.n. anxiety. (dispense #30 no  refills). 3. (New) OxyContin 10 mg 1 q.a.m., 2 q.p.m. (dispense #30 no refills). 4. (New) Dilantin 100 mg p.o. q.8h. 5. (New) Neurontin 300 mg p.o. q.8h. 6. (New) Enteric-coated aspirin 325 mg q.d. 7. (New) oxycodone 5 mg 1-2 q.4h. p.r.n. breakthrough (dispense #30 no    refills).  * Do not take Glucophage (sugars normal in the hospital, glycosylated   hemoglobin normal). * Do not use Duragesic patch, Ultram, Percocet, Librax, or Phenergan.  CONDITION ON DISCHARGE:  Stable.  No recurrent dystonia or evidence of seizure activity.  Awake, alert, and appropriate, status post TEE.  RECOMMENDED ACTIVITY:  As tolerated.  No driving or operating heavy equipment. The patient is also mildly orthostatic likely secondary to pain medications, and we instructed him to stand at the edge of the bed or chair for approximately a minute before taking off in order to avoid any lightheadedness or problems.  RECOMMENDED DIET:  Drink plenty of fluids.  No concentrated sweets.  FOLLOW-UP: 1. The patient is to call Dr. Foy Guadalajara to schedule an appointment for next    Wednesday.  At that visit, he should have a Dilantin level, CBC, and CMP    checked.  He should also be scheduled for a repeat 2-D echocardiogram in  approximately two months to reassess LV function and to have his pain    medications adjusted and refilled if needed. 2. The patient is to call Dr. Orlin Hilding to schedule a follow-up appointment in    2-3 weeks. 3. The patient is to call Dr. Kellie Simmering to schedule a follow-up appointment in    approximately two weeks.  The tumor necrosis factor receptor level is still    pending at the time of discharge as well as the prothrombin gene mutation    and antiphospholipid antibody assessment.  CONSULTANTS: 1. Critical care medicine (for initial intubation). 2. Marcelino Freestone, M.D. 3. Stacey Drain, M.D. 4. Armanda Magic, M.D. (to perform transesophageal echocardiogram). 5. Carlye Grippe, M.D.  (psychiatric consultation).  PROCEDURES: 1. Head CT (12/17):  No significant abnormalities. 2. EEG (12/18):  No evidence of seizure focus.  Mild swelling possibly due to    medications. 3. MRI (12/19):  Questionable subacute infarction versus toxic or metabolic    injury to the left globus pallidus region. 4. Cerebral angiography (12/20):  Left anterior cerebral arteries show an area    of decreased profusion in the high medial posterior parietal region.  Very    subtle.  Left external carotid shows evidence of marked hyperemia in the    nasopharyngeal region.  Also to some extent on the right.  Normal posterior    circulation.  No evidence of intracranial vasculitis. 5. EEG (12/20):  No epileptic focus. 6. Sinus CT (12/22):  Clear. 7. Transesophageal echocardiogram (12/24):  Trace MR.  Minimal lipomatous    septal hypertrophy, can be normal variance.  No shunting.  No effusion.  No    evidence of source of embolus.  Normal aorta. 8. EKG:  Normal sinus rhythm.  Inverted T waves in lead V1, V2, and V3. 9. Transthoracic echocardiogram (12/20):  LV mildly dilated.  Overall LV    systolic function mildly to moderately decreased.  Ejection fraction    estimated 40-45%.  Aortic valve thickness was mildly increased.  Mild    mitral regurgitation.  Left atrial size at the upper limits of normal.  By    TEE, the left atrium appeared normal in size.  With the TEE, Dr. Mayford Knife was    not able to assess LV function and, therefore, could not compare to this    transthoracic echocardiogram.  HOSPITAL COURSE: #1 - PRESUMED SEIZURE:  Kenneth Jones is a 40 year old Caucasian gentleman with no history of seizure disorder, who presents with a decreased loss of consciousness.  He was found by his family to be slumped over his computer  keyboard, incontinent, and having bit his tongue.  He is brought to the emergency room and because of a presumed postictal state, he is really unable to protect his airway,  and he requires intubation.  For this reason, critical care was called to see the patient initially.  The following day, he was extubated without difficulty.  Now Kenneth Jones is a very interesting gentleman with an unusual past medical history.  For the past 1-1/2 years he has been seen by a rheumatologist, Dr. Kellie Simmering, and two infectious disease specialists in an attempt to discover why he is having recurrent fevers and polyarthralgias as well as severe, chronic pain.  His medications at the time of presentation included two 100 mcg fentanyl patches which were on him as well as Ultram and Celexa.  The Ultram was started approximately one month prior.  Dr. Orlin Hilding, neurologist, was consulted initially to see this patient.  Because he had a temperature of 102 on admission, a lumbar puncture was performed which did not show evidence of cerebral spinal fluid abnormality or infection.  During the comprehensive inpatient evaluation, he was found to have an area of possible ischemia in the globus pallidus region.  This was really not felt to be contributing to this possible seizure but as will be dictated below, was not completely explained.  As far as the possible seizures are concerned, Kenneth Jones continued to have dystonic spells in the hospital, but there were no abnormalities on EEG.  It was unclear if these were partial seizures or again, simple dystonia.  We ultimately decided to place him on Dilantin and after doing so, his symptoms dramatically improved as did his general mental state and perception.  In an attempt to treat any seizures as well as his chronic pain, we also added Neurontin.  Because he is on Dilantin, he should have a CMET and a CBC and a Dilantin level checked in approximately one week.  His Dilantin level on the day of discharge was 7.0.  Dr. Foy Guadalajara wilL check these and follow up in his office and refer any abnormalities or problems to Dr. Orlin Hilding.  The patient is to call Dr.  Galvin Proffer office to schedule an appointment in approximately two weeks.  #2 - LEFT GLOBUS PALLIDUS ISCHEMIA:  As I stated above, Kenneth Jones was found to have an area of acute change on the MRI consistent with ischemia or toxic/metabolic abnormality.  Now his urine drug screen was negative for any illicit drugs, and there were no other laboratory abnormalities suggesting significant metabolic derangement.  It is unlikely that a postictal state would cause enough hypoxia to result in ischemia, and this is certainly not a typical watershed area, and so we opted to place him on an aspirin and asked Dr. Mayford Knife, cardiologist, to perform a TEE to look for possible embolic source.  This was negative for embolic source, and he ultimately did undergo cerebral angiogram.  The angiogram did show diminished blood flow consistent in the area of the stroke/injury, but there were no other intracranial evidence of stenoses or vasculitis.  He did have some inflammation of the vessels in the external carotid distribution, as they run through the nasopharyngeal area, but this was felt not to be significant, and he had no evidence of sinusitis by CT scan.  At this point, we will maintain him on aspirin therapy, and he will follow up with Dr. Orlin Hilding.  #3 - CHRONIC PAIN SYNDROME:  Kenneth Jones has suffered from chronic musculoskeletal pain for at least 1-1/2 years.  He describes the pain as an intense ache that starts in his legs and runs up his back and into his arms. Dr. Foy Guadalajara had recently placed him on a fentanyl patch and added Ultram in order to get him some relief.  He has had associated fevers and joint swelling with this, and he has seen Dr. Maurice March, infectious disease, as well as Dr. Kellie Simmering, rheumatologist.  Because Kenneth Jones did have a fever on this admission and an elevated sed rate of 66, I did ask Dr. Kellie Simmering to come by and see him. Dr. Kellie Simmering recommended hypercoagulability work-up, which was done,  and negative with the exception of pending labs, as stated above.  He also brought up the possibility that this could be a form of relapsing Mediterranean fever. Kenneth Jones does have some characteristics of this disease, but it is very rare. In order to help  delineate this possibility, Dr. Kellie Simmering did order a very specialized laboratory test called a tumor necrosis factor receptor level. This was sent out as a specialty lab and is pending at the time of discharge. Kenneth Jones will follow-up with Dr. Kellie Simmering in approximately 2-3 weeks to follow-up on these results and also discuss other symptoms of his complex.  #4 - ABNORMAL 2-D ECHOCARDIOGRAM:  Kenneth Jones did have a reduced ejection fraction by echocardiogram.  The etiology of this is really unclear.  I did talk with Dr. Mayford Knife.  During the TEE, she was unable to advance the scope low enough to really see the ventricles well, but the transthoracic is actually better at looking at ejection fraction.  There was no evidence of intramuscular invasion concerning for amyloidosis or similar.  It would be very unlikely for Kenneth Jones at age 65 to have coronary artery disease, and we felt it was reasonable to repeat a 2-D echocardiogram in approximately two months.  Dr. Mayford Knife will be happy to see him back as needed.  Of note, his initial EKG did demonstrate some inverted T waves anteriorly, and Dr. Mayford Knife felt this was consistent with his central nervous system issues.  His CKs were normal on admission.  He did have an elevated troponin I, but the significance of this is again unclear.  He denied any chest pain throughout the whole hospitalization  #5 - HYPERGLYCEMIA:  Kenneth Jones has a history of hyperglycemia.  This admission his sugars were normal.  He was on Glucophage prior to admission, and I did stop this, as his glycosylated hemoglobin was normal.  #6 - DEPRESSION/ANXIETY:  I did have Dr. Claudette Head, a psychiatrist, see the patient in the hospital  to make recommendations concerning treatment of his depression  and anxiety.  Dr. Claudette Head recommended continuing the Celexa at 40 mg daily and also adding Ativan 3 times a day.  He received it 3 times a day, 1 mg in the hospital, but I opted to put him on 0.5 mg t.i.d. p.r.n. as an outpatient.  PERTINENT AND DISCHARGE LABORATORY DATA:  Hemoglobin 13.7, MCV 87, WBC 13,600 (down from 28,500 on admission), platelet count 198.  Sedimentation rate December 18 was 7.  Repeat December 19 was 66.  Sodium 144, potassium 3.9, chloride 107, bicarb 30, BUN 8, creatinine 1.1.  Glycosylated hemoglobin 5.6.  Homocystine level normal at 9.19.  Serum IgA, IgG, and IgM all within normal limits.  Lipid panel:  Total cholesterol 204, triglycerides 200, HDL 39, LDL 125.  * Recommend diet modification.  Factor V Leiden gene mutation negative.  Lupus anticoagulant panel negative.  Protein C total elevated at 159 with a functional level normal at 152. Protein S total elevated at 161 with a functional level normal at 102.  TSH 2.294: 1. Prolactin level 18.7 (normal).  Urine drug screen: 1. Negative. 2. Positive for benzodiazepines (given for intubation):  Interesting that    fentanyl patch did not show up as a narcotic.  CSF:  VDRL nonreactive, culture negative.  Serum ANA negative.  ADDENDUM:  Would consider referral to chronic pain clinic to help with management of medications and currently undefined syndrome.  I discussed this with patient and family, and he is willing. DD:  06/02/00 TD:  06/04/00 Job: [id number]

## 2010-10-26 NOTE — Consult Note (Signed)
NAME:  Kenneth Jones, Kenneth Jones NO.:  [id number]   MEDICAL RECORD NO.:  [id number]          PATIENT TYPE:  EMS   LOCATION:  [facility]                         FACILITY:  [facility]   PHYSICIAN:  Melvyn Novas, M.D.  DATE OF BIRTH:  23-Apr-1971   DATE OF CONSULTATION:  03/14/2005  DATE OF DISCHARGE:                                   CONSULTATION   He is unable to give any information why a code stroke was called. The  patient states he has a history of seizures and had a stroke in 2002. He  apparently told the ambulance driver that he is concerned about a renewed  stroke event and this led to him being announced as a stroke. The patient  has presented here and appears distressed, tearful. He stutters but he has  no focal weakness. The patient is during this examination very logorrheic.  Explained that he has had a pain syndrome which has been insufficiently  treated with fentanyl. He has trouble sleeping, has trouble getting rest. He  has been placed on seizure medications but there seems to be a psychiatric  component to his complaints. He also states that he has pain all over his  joints and sometimes myalgias and that Dr. Foy Guadalajara is his primary care  physician who has recently referred him to a specialist in [place]. I am  not sure if I understood the name correctly but the name is something like  Dr. Temple Pacini and this doctor has not yet evaluated him.   On reevaluation of the patient, he states that he is in pain and rates his  pain severity at 8/10. His airway is intact. He is pleasant, cooperative but  appears extremely anxious. His vital signs are 124/80, his heart rate is in  the 90s. He is showing a respiratory rate of 18. His lungs are clear to  auscultation. He has no cardiac murmur. No carotid bruit. No peripheral  clubbing, cyanosis or edema. He can move all four extremities. He has no  patchy or focal numbness. No cranial nerve deficits. Suddenly, he develops  an all  body seizure-like activity. He keeps his eyes open, his pupils are  dilated but within a minute the seizure activity stops. The patient utters  I am sorry I did that.  His vital signs did not change. He remained  tachycardiac around 100. His blood pressure did not rise and it is  remarkable that for a while after this seizure event took place he speaks  without stuttering, fluent. I explained to him that his CT looks good and  that we do not see evidence of a stroke and he is profoundly thankful again,  not stuttering at this moment.   Review of his ER records shows alone for the year 2006, six prior ER  admissions and evaluations. He received Dilaudid with his last visit on  March 02, 2005 intravenously. Stated he felt better and was discharged  home at 2:30 in the afternoon. At this time, I am still awaiting some of his  laboratory results, but again the patient is in no acute  distress. He is  lying down, breathing regularly. His temperature is 98.4 and I have a strong  suspicion that the seizure might be nonepileptic in nature. The patient  himself states that his main complaint at this time is pain. There is no  further mentioning of any stroke symptoms of any weakness or of any seizure  activity over the last 90 minutes.   PAST MEDICAL HISTORY:  Bipolar, depression, chronic pain, somatization  disorder, reflux disease, obesity, history of migraines.   MEDICATIONS:  Fentanyl patch, Lyrica.   ALLERGIES:  The patient says he is allergic to CODEINE.   FAMILY HISTORY:  Diabetes, coronary artery disease, and prostate cancer.   SOCIAL HISTORY:  The patient is married, lives with his wife. He is  unemployed. Nonsmoker. Will drink alcohol socially.   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 If the patient's pain can be treated successfully  in the emergency room, he indicated that he would like to go home. His wife  is  waiting in the waiting room.           ______________________________  Melvyn Novas, M.D.     CD/MEDQ  D:  03/14/2005  T:  03/15/2005  Job:  [id number]   cc:   [facility]   Molly Maduro L. Foy Guadalajara, M.D.  Fax: [phone]

## 2010-10-26 NOTE — Consult Note (Signed)
[facility]  Patient:    Kenneth Jones, Kenneth Jones                      MRN: [id number] Proc. Date: 05/26/00 Adm. Date:  [id number] Attending:  Gailen Shelter Dictator:   Raynelle Jan, M.D.                          Consultation Report  DATE OF BIRTH:  04-Sep-1970.  ATTENDING PHYSICIAN:  Dr. Danice Goltz.  HISTORY OF PRESENT ILLNESS:  Kenneth Jones is a 40 year old male with a past medical history of depression, chronic pain and an unknown "illness" manifesting as fevers, night sweats, joint pain and GI complaints, who had been in his usual state of health until yesterday, when he was found unresponsive at home by his wife at approximately 6 p.m.  At approximately 9 a.m., his wife spoke to him by phone; he was alert, awake and acting normally.  However, upon arriving to the house after work, she found him to be difficult to arouse, was slumped over his computer desk, drooling out of the side of his mouth and incontinent of urine.  There is no known overdose of any medications from his house; however, he reportedly was wearing two Fentanyl patches at presentation to the ED.  He has complained of some intermittent fevers and chills secondary to his prolonged illness; however, no new headaches, vision changes or stiff neck to his wife.  Overnight, he was intubated secondary to the inability to control his secretions and sedated with Ativan, where this morning he remains sedated but has undergone successful extubation.  PAST MEDICAL HISTORY 1. Depression. 2. Reflux. 3. An unknown illness, as described above, which was status post a trip to    [place] in 1998.  Apparently, he has undergone extensive workup by his    primary M.D.; however, the records are not available currently. 4. History of migraine. 5. History of head trauma as a child with no residual effects.  MEDICATIONS:  Fentanyl patch, Celexa, Zanaflex and Vioxx.  ALLERGIES:  He is allergic to  CODEINE which causes nausea.  SOCIAL HISTORY:  He lives with his wife and is unemployed, having lost his job secondary to his illness.  He is a nonsmoker and occasionally will drink one to alcoholic beverages with social events.  FAMILY HISTORY:  Significant for diabetes, coronary artery disease and prostate cancer.  There is no history of any type of seizure disorder.  REVIEW OF SYSTEMS:  His review of systems is significant for the intermittent fevers and chills, night sweats, joint aches and diarrhea.  PHYSICAL EXAMINATION  VITAL SIGNS:  Current temperature 101.2; T-max 102.9.  Pulse 125.  Blood pressure 106/55.  Respirations 22.  He is saturating 93% on 3 L per nasal cannula.  GENERAL:  He is sedated, would move his head and his extremities briefly to his wifes voice but would not open his eyes or follow commands for me.  CARDIOVASCULAR:  He has a normal S1, S2; is tachycardic with a regular rate and rhythm.  No murmurs, rubs, or gallops.  LUNGS:  Clear to auscultation bilaterally.  ABDOMEN:  Soft, nontender, nondistended.  Slightly hypoactive bowel sounds.  EXTREMITIES:  He has no clubbing, cyanosis, or edema.  NEUROLOGIC:  His mental status is as stated above.  His pupils are equal, round and reactive to light, going from 3 to 2 mm.  He has  a positive corneal and positive gag per nursing report.  His tongue is significant for a bite mark on the left lateral aspect.  There are negative dolls eyes.  Sensory: He initially had no withdrawal to pain; however, as sedation lightened, he would withdraw all four extremities to pain.  Motor:  He would move all four extremities with 5/5 strength.  His reflexes were 1+ and equal bilaterally. Babinski with downgoing toes x 2.  LABORATORY AND X-RAY FINDINGS:  Labs on admission included a negative urine drug screen, alcohol level of less than 10, salicylate level of less than 4. His white count was 28.5 with greater than 20% bands.   His comprehensive metabolic panel was significant for a hemolyzed potassium of 6.6, a BUN of 26 and a creatinine of 1.8.  His SGOT was slightly elevated at 77 and an SGPT was slightly elevated at 59.  Sed rate was normal at 7.  His total CK was 770 with an MB fraction of 10.3 and an index of 1.3.  UA showed moderate blood but otherwise negative.  His head CT showed no active disease.  ASSESSMENT AND PLAN:  This is a 40 year old male with an unresponsive episode at home which is thought to be possibly a seizure, given his constellation of symptoms including tongue biting, drooling, urinary incontinence and the elevated CKs.  It is felt that what he is exhibiting now could possibly be a prolonged postictal state as well as residual medication effects.  The other differentials for his illness include delirium or meningitis secondary to his fevers and elevated white count versus some sort of further manifestation of his unknown illness versus a medication overdose; however, his urine drug screen was negative.  RECOMMENDATIONS 1. For his possible seizure, we agree with obtaining an EEG which could    hopefully be performed later today; we would also recommend getting an MRI    for further imaging, now that the patient has been extubated, once he is    more stable from a cardiopulmonary standpoint. 2. We will complement the fever workup, which has already been undertaken by    Critical Care Medicine, and check an LP to rule out meningitis as the cause    of his unresponsive episode. 3. We will attempt to hold any further sedating medications, now that he is    extubated, and follow his mental status. 4. Obtain workup from his primary M.D. with regards to his "illness."  Thank you for the interesting consult.  We will follow the patient with you closely. DD:  05/27/00 TD:  05/28/00 Job: [id number]

## 2010-10-26 NOTE — Procedures (Signed)
[place]  Patient:    Kenneth Jones, Kenneth Jones                      MRN: [id number] Proc. Date: 03/19/00 Adm. Date:  [id number] Disc. Date: [id number] Attending:  Rich Brave CC:         Marinda Elk, M.D.  Aundra Dubin, M.D.   Procedure Report  PROCEDURE PERFORMED:  Upper endoscopy with biopsies.  ENDOSCOPIST:  Florencia Reasons, M.D.  INDICATIONS FOR PROCEDURE:  The patient is a 40 year old gentleman with abdominal pain, diarrhea, weight loss and fevers, felt to possibly have some sort of connective tissue or rheumatologic disorder.  This is being done to help rule out Wirthls disease.  FINDINGS:  Normal exam except for a small hiatal hernia.  DESCRIPTION OF PROCEDURE:  The nature, purpose and risks of the procedure had been discussed with the patient, who provided written consent.  Sedation was droperidol 5 mg, fenanyl 125 mcg and Versed 12 mg IV without arrhythmias other than sinus tachycardia, desaturation or clinical instability.  The Olympus GIF 160 small caliber adult video endoscope was passed under direct vision.  The vocal cords were not well seen.  The esophagus was easily entered and was endoscopically normal, without evidence of reflux esophagitis, Barretts esophagus, varices, infection or neoplasia.  A small hiatal hernia was present, best seen during retroflex viewing of the proximal stomach.  No other abnormalities in the cardia were observed and the stomach was basically normal, without any significant residual (just a small clear residual which was suctioned up) and without erosions, ulcers, polyps or masses.  The duodenal bulb had some patchy erythema not felt to be clinically significant; the pylorus itself was symmetric and without evidence of scarring or stenosis. The second and third portions of the duodenum were endoscopically normal. Multiple biopsies were obtained, passing biopsy forceps as far down  the duodenal lumen as possible and also from the more proximal sections of the second and third duodenum.  The scope was then removed from the patient, who tolerated the procedure well without apparent complication.  IMPRESSION: 1. Small hiatal hernia. 2. No endoscopically evident source of patients symptoms identified.  PLAN:  Await pathology on todays biopsies. DD:  03/19/00 TD:  03/20/00 Job: [id number]

## 2010-10-26 NOTE — Procedures (Signed)
[facility]  Patient:    Kenneth Jones, Kenneth Jones                     MRN: [id number] Proc. Date: 05/29/00 Attending:  Marlan Palau, M.D.                           Procedure Report  PROCEDURE:  Electroencephalogram.  EGL Number [id number]  NEUROLOGIST:  Marlan Palau, M.D.  HISTORY:  This is a 40 year old with episodes of feeling confused, spells of stiffening of the legs and extremities for up to 10 minutes.  The patient is being evaluated for possible seizure events.  EEG CLASSIFICATION:  Essentially normal, awake and drowsy.  DESCRIPTION OF RECORDING:  This recording consists of a moderately well modulated medium amplitude rhythm of 10 to 11 Hz.  Throughout the recording, there appeared to be some overlying beta frequency activity.  Some episodes of slowing are seen during periods of presumed drowsiness with 5 to 6 Hz activity.  ______ rhythm activity do appear to be reactive to some degree. Photic stimulation and hyperventilation were not performed.  At no time during the recording did there appear to be evidence of actual spike of ______ discharges or evidence of focal slowing.  EKG monitor shows no evidence of cardiac arrhythmias with a heart rate of 72.  IMPRESSION:  This is an essentially electroencephalogram recording in an awake and drowsy state.  Some overlying beta frequency activity seen which may be medication effect.  No clear epileptic form discharges are seen at any time.  Thank you very much. DD:  05/29/00 TD:  05/31/00 Job: [id number]

## 2010-10-26 NOTE — Op Note (Signed)
[place]  Patient:    Kenneth Jones, Kenneth Jones                      MRN: [id number] Proc. Date: 07/30/99 Adm. Date:  [id number] Attending:  Rosanne Sack CC:         Dr. Lorrine Kin, M.D.                           Operative Report  PROCEDURE:  Colonoscopy with biopsies.  INDICATIONS:  A 40 year old with question of inflammatory bowel disease.  He has a history of irregular bowel habits, intermittent rectal bleeding, as well as a history of oral ulcerations, polyarthralgias, and low grade fevers.  FINDINGS:  Grossly normal examination to the cecum.  DESCRIPTION OF PROCEDURE:  The nature, purpose, and risks of the procedure had een discussed with the patient who provided written consent and was brought from his hospital room to the endoscopy unit.  Sedation was droperidol 5 mg, fentanyl 100 mcg, and Versed 10 mg IV without arrhythmias or desaturation.  The procedure was done unprepped.  Perianal examination did not show any fissures, fistuli, or sentinel piles, although, the patient has a history of a previous operation for a perirectal abscess.  I did not see any postsurgical deformity.  Digital examination of the prostate demonstrated no obvious bogginess, tenderness, or mass effect.  There was no evident anal stenosis.  The Olympus pediatric video colonoscope was advanced quite easily around the colon to the cecum, being able to advance the scope roughly to the level of the ileocecal valve and to see the majority of the cecal surface area.  The terminal ileum could not readily be intubated.  Pullback was therefore performed.  Despite the absence of a prep, there was very little residual stool in the colon. There was some semiformed or pasty stool in the rectosigmoid, but the proximal colon was pretty much devoid of stool, allowing me to have a reasonably good look. Small lesions could conceivably have  been obscured, however.  This was essentially a normal examination.  The last several cm of the rectum had a relative loss of vascularity.  However, no granularity, friability, exudate, or  hemorrhage were evident.  I think this finding is nonspecific and is of dubious  clinical significance.  The remainder of the colonic mucosa was normal, beginning just a few cm above the anal verge, where the rectal mucosa had a nice vascular  pattern.  This remained the case throughout the remainder of the examination all the way to the cecum, and I did not see any polyps, cancer, vascular malformations, or diverticular disease.  Retroflexion was attempted, but could not readily be accomplished in the rectum, but antegrade viewining did not disclose any additional abnormalities apart from those mentioned above.  There appeared to be moderate internal hemorrhoids during pullout through the anal canal.  Random mucosal biopsies were obtained along the length of the colon, placing those from the distal rectum in a separate jar.  IMPRESSION:  Essentially normal colonoscopy, with questionable mild distal proctitis.  No evidence of frank inflammatory bowel disease.  PLAN:  Await pathology on todays biopsies. DD:  07/30/99 TD:  07/31/99 Job: [id number]

## 2011-03-10 DIAGNOSIS — Z951 Presence of aortocoronary bypass graft: Secondary | ICD-10-CM | POA: Insufficient documentation

## 2017-11-11 ENCOUNTER — Emergency Department (HOSPITAL_COMMUNITY): Payer: Self-pay

## 2017-11-11 ENCOUNTER — Emergency Department (HOSPITAL_COMMUNITY)
Admission: EM | Admit: 2017-11-11 | Discharge: 2017-11-11 | Disposition: A | Discharge status: Home / Self Care | Payer: Self-pay | Attending: Emergency Medicine | Admitting: Emergency Medicine

## 2017-11-11 ENCOUNTER — Other Ambulatory Visit: Payer: Self-pay

## 2017-11-11 ENCOUNTER — Encounter (HOSPITAL_COMMUNITY): Payer: Self-pay | Admitting: Emergency Medicine

## 2017-11-11 DIAGNOSIS — E119 Type 2 diabetes mellitus without complications: Secondary | ICD-10-CM | POA: Insufficient documentation

## 2017-11-11 DIAGNOSIS — Z79899 Other long term (current) drug therapy: Secondary | ICD-10-CM | POA: Insufficient documentation

## 2017-11-11 DIAGNOSIS — Z7984 Long term (current) use of oral hypoglycemic drugs: Secondary | ICD-10-CM | POA: Insufficient documentation

## 2017-11-11 DIAGNOSIS — Z7982 Long term (current) use of aspirin: Secondary | ICD-10-CM | POA: Insufficient documentation

## 2017-11-11 DIAGNOSIS — R072 Precordial pain: Secondary | ICD-10-CM | POA: Insufficient documentation

## 2017-11-11 HISTORY — DX: Acute myocardial infarction, unspecified: I21.9

## 2017-11-11 HISTORY — DX: Type 2 diabetes mellitus without complications: E11.9

## 2017-11-11 HISTORY — DX: Unspecified convulsions: R56.9

## 2017-11-11 LAB — I-STAT TROPONIN, ED
TROPONIN I, POC: 0 ng/mL (ref 0.00–0.08)
TROPONIN I, POC: 0 ng/mL (ref 0.00–0.08)

## 2017-11-11 LAB — CBC
HEMATOCRIT: 47.6 % (ref 39.0–52.0)
HEMOGLOBIN: 17.1 g/dL — AB (ref 13.0–17.0)
MCH: 33.2 pg (ref 26.0–34.0)
MCHC: 35.9 g/dL (ref 30.0–36.0)
MCV: 92.4 fL (ref 78.0–100.0)
Platelets: 163 10*3/uL (ref 150–400)
RBC: 5.15 MIL/uL (ref 4.22–5.81)
RDW: 13.3 % (ref 11.5–15.5)
WBC: 9.5 10*3/uL (ref 4.0–10.5)

## 2017-11-11 LAB — BASIC METABOLIC PANEL
Anion gap: 9 (ref 5–15)
BUN: 18 mg/dL (ref 6–20)
CO2: 25 mmol/L (ref 22–32)
Calcium: 9.2 mg/dL (ref 8.9–10.3)
Chloride: 106 mmol/L (ref 101–111)
Creatinine, Ser: 0.92 mg/dL (ref 0.61–1.24)
GLUCOSE: 287 mg/dL — AB (ref 65–99)
POTASSIUM: 3.9 mmol/L (ref 3.5–5.1)
Sodium: 140 mmol/L (ref 135–145)

## 2017-11-11 LAB — D-DIMER, QUANTITATIVE: D-Dimer, Quant: <0.27 ug/mL-FEU (ref 0.00–0.50)

## 2017-11-11 MED ORDER — LORAZEPAM 2 MG/ML IJ SOLN
1.0000 mg | Freq: Once | INTRAMUSCULAR | Status: AC
  Administered 2017-11-11: 1 mg via INTRAVENOUS
  Filled 2017-11-11: qty 1

## 2017-11-11 MED ORDER — DIPHENHYDRAMINE HCL 50 MG/ML IJ SOLN
25.0000 mg | Freq: Once | INTRAMUSCULAR | Status: AC
  Administered 2017-11-11: 25 mg via INTRAVENOUS
  Filled 2017-11-11: qty 1

## 2017-11-11 MED ORDER — IOPAMIDOL (ISOVUE-370) INJECTION 76%
INTRAVENOUS | Status: AC
  Filled 2017-11-11: qty 100

## 2017-11-11 MED ORDER — ONDANSETRON HCL 4 MG/2ML IJ SOLN
4.0000 mg | Freq: Once | INTRAMUSCULAR | Status: AC
  Administered 2017-11-11: 4 mg via INTRAVENOUS
  Filled 2017-11-11: qty 2

## 2017-11-11 MED ORDER — MORPHINE SULFATE (PF) 4 MG/ML IV SOLN
4.0000 mg | Freq: Once | INTRAVENOUS | Status: AC
  Administered 2017-11-11: 4 mg via INTRAVENOUS
  Filled 2017-11-11: qty 1

## 2017-11-11 MED ORDER — IOPAMIDOL (ISOVUE-370) INJECTION 76%
100.0000 mL | Freq: Once | INTRAVENOUS | Status: AC | PRN
  Administered 2017-11-11: 12:00:00 via INTRAVENOUS
  Administered 2017-11-11: 100 mL via INTRAVENOUS

## 2017-11-11 MED ORDER — HYDROMORPHONE HCL 1 MG/ML IJ SOLN
1.0000 mg | Freq: Once | INTRAMUSCULAR | Status: AC
  Administered 2017-11-11: 1 mg via INTRAVENOUS
  Filled 2017-11-11: qty 1

## 2017-11-11 NOTE — ED Notes (Signed)
Bed: WA05 Expected date:  Expected time:  Means of arrival:  Comments: 

## 2017-11-11 NOTE — ED Provider Notes (Signed)
[facility]-EMERGENCY DEPT Provider Note   CSN: [id number] Arrival date & time: 11/11/17  1005     History   Chief Complaint Chief Complaint  Patient presents with  . Chest Pain    HPI JAMAREON SHIMEL is a 47 y.o. male.  HPI  Patient is a 47 year old male with a history of coronary artery bypass grafting who presents to the emergency department with complaints of acute onset left-sided chest pain with radiation towards his left shoulder and neck.  He reports nausea without vomiting or diarrhea.  He reports tremor.  He denies weakness of his arms or legs.  No history of DVT or pulmonary embolism.  No recent unilateral leg swelling.  Pain is moderate to severe in severity at this time.  His coronary artery bypass grafting was completed several years ago in [place].  No fevers or chills.  No cough.  No shortness of breath.  Pain is moderate to severe in severity at this time.  Nothing worsens or improves his pain.  No rash.  No recent injury or trauma.  Not worse with movement or palpation.  Past Medical History:  Diagnosis Date  . Diabetes mellitus without complication (HCC)   . MI (myocardial infarction) (HCC)   . Seizures (HCC)     There are no active problems to display for this patient.   Past Surgical History:  Procedure Laterality Date  . CARDIAC SURGERY          Home Medications    Prior to Admission medications   Medication Sig Start Date End Date Taking? Authorizing Provider  aspirin EC 81 MG tablet Take 81 mg by mouth daily.   Yes [provider]  clopidogrel (PLAVIX) 75 MG tablet Take 75 mg by mouth daily.   Yes [provider]  diclofenac sodium (VOLTAREN) 1 % GEL Apply 2 g topically 4 (four) times daily.   Yes [provider]  fentaNYL (DURAGESIC - DOSED MCG/HR) 75 MCG/HR Place 75 mcg onto the skin every 3 (three) days.   Yes [provider]  HYDROcodone-Acetaminophen 7.5-300 MG TABS  Take 1 tablet by mouth every 4 (four) hours as needed (PAIN).   Yes [provider]  ibuprofen (ADVIL,MOTRIN) 200 MG tablet Take 800 mg by mouth daily as needed for moderate pain.   Yes [provider]  lamoTRIgine (LAMICTAL) 200 MG tablet Take 400 mg by mouth daily.   Yes [provider]  lisinopril (PRINIVIL,ZESTRIL) 5 MG tablet Take 5 mg by mouth daily.   Yes [provider]  metFORMIN (GLUCOPHAGE) 1000 MG tablet Take 1,000 mg by mouth daily.   Yes [provider]  metoprolol tartrate (LOPRESSOR) 25 MG tablet Take 25 mg by mouth daily with supper.   Yes [provider]  Multiple Vitamin (MULTIVITAMIN WITH MINERALS) TABS tablet Take 1 tablet by mouth daily.   Yes [provider]  oxyCODONE-acetaminophen (PERCOCET) 7.5-325 MG tablet Take 1 tablet by mouth every 4 (four) hours as needed for severe pain.   Yes [provider]  tamsulosin (FLOMAX) 0.4 MG CAPS capsule Take 0.4 mg by mouth daily.   Yes [provider]    Family History No family history on file.  Social History Social History   Tobacco Use  . Smoking status: Not on file  Substance Use Topics  . Alcohol use: Not on file  . Drug use: Not on file     Allergies   Morphine and related  Review of Systems Review of Systems  All other systems reviewed and are negative.    Physical Exam Updated Vital Signs BP (!) 142/91 (BP Location: Left Arm)   Pulse 88   Temp 98.1 F (36.7 C) (Oral)   Resp 19   SpO2 98%   Physical Exam  Constitutional: He is oriented to person, place, and time. He appears well-developed and well-nourished.  HENT:  Head: Normocephalic and atraumatic.  Eyes: EOM are normal.  Neck: Normal range of motion.  Cardiovascular: Normal rate, regular rhythm, normal heart sounds and intact distal pulses.  Pulmonary/Chest: Effort normal and breath sounds normal. No respiratory distress.  Abdominal: Soft. He exhibits no  distension. There is no tenderness.  Musculoskeletal: Normal range of motion.  Neurological: He is alert and oriented to person, place, and time.  Skin: Skin is warm and dry.  Psychiatric: He has a normal mood and affect. Judgment normal.  Nursing note and vitals reviewed.    ED Treatments / Results  Labs (all labs ordered are listed, but only abnormal results are displayed) Labs Reviewed  BASIC METABOLIC PANEL - Abnormal; Notable for the following components:      Result Value   Glucose, Bld 287 (*)    All other components within normal limits  CBC - Abnormal; Notable for the following components:   Hemoglobin 17.1 (*)    All other components within normal limits  D-DIMER, QUANTITATIVE (NOT AT [facility])  I-STAT TROPONIN, ED  I-STAT TROPONIN, ED    EKG EKG Interpretation #1  Date/Time:  Tuesday November 11 2017 10:12:47 EDT Ventricular Rate:  131 PR Interval:    QRS Duration: 86 QT Interval:  303 QTC Calculation: 448 R Axis:   116 Text Interpretation:  Sinus tachycardia Low voltage with right axis deviation No significant change was found    EKG Interpretation #2  Date/Time:  Tuesday November 11 2017 14:38:47 EDT Ventricular Rate:  90 PR Interval:    QRS Duration: 93 QT Interval:  365 QTC Calculation: 447 R Axis:   114 Text Interpretation:  Sinus rhythm Right axis deviation Borderline low voltage, extremity leads No significant change was found Confirmed by Azalia Bilis ([id number]) on 11/11/2017 3:21:14 PM       Radiology Dg Chest Portable 1 View  Result Date: 11/11/2017 CLINICAL DATA:  Pt complaint of sudden onset left chest pain with associated left jaw/arm pain and nausea. Pt tremor/chills with triage. Verbalizes hx of heart attack and open heart surgery. DM, h/o MI, never smoker EXAM: PORTABLE CHEST 1 VIEW COMPARISON:  Chest x-ray dated 08/10/2006. FINDINGS: Surgical changes of median sternotomy for presumed CABG. Heart size is within normal limits. Overall cardiomediastinal  silhouette is within normal limits in size and configuration. Lungs are clear. No pleural effusion or pneumothorax seen. No acute or suspicious osseous finding. IMPRESSION: No active disease.  No evidence of pneumonia or pulmonary edema. Electronically Signed   By: Bary Richard M.D.   On: 11/11/2017 10:49   Ct Angio Chest/abd/pel For Dissection W And/or Wo Contrast  Result Date: 11/11/2017 CLINICAL DATA:  Acute onset left chest pain today with jaw pain and nausea. Tremors and chills. EXAM: CT ANGIOGRAPHY CHEST, ABDOMEN AND PELVIS TECHNIQUE: Multidetector CT imaging through the chest, abdomen and pelvis was performed using the standard protocol during bolus administration of intravenous contrast. Multiplanar reconstructed images and MIPs were obtained and reviewed to evaluate the vascular anatomy. CONTRAST:  100 ml ISOVUE-370 IOPAMIDOL (ISOVUE-370) INJECTION 76% COMPARISON:  CT abdomen and pelvis 10/28/2015.  FINDINGS: CTA CHEST FINDINGS Cardiovascular: Preferential opacification of the thoracic aorta. No evidence of thoracic aortic aneurysm or dissection. Normal heart size. No pericardial effusion. The patient is status post CABG. Bovine type aortic arch incidentally noted. Mediastinum/Nodes: No enlarged mediastinal, hilar, or axillary lymph nodes. Thyroid gland, trachea, and esophagus demonstrate no significant findings. Lungs/Pleura: Lungs are clear. No pleural effusion or pneumothorax. Musculoskeletal: Negative. Review of the MIP images confirms the above findings. CTA ABDOMEN AND PELVIS FINDINGS VASCULAR Aorta: Normal caliber aorta without aneurysm, dissection, vasculitis or significant stenosis. Celiac: Patent without evidence of aneurysm, dissection, vasculitis or significant stenosis. SMA: Patent without evidence of aneurysm, dissection, vasculitis or significant stenosis. Renals: Both renal arteries are patent without evidence of aneurysm, dissection, vasculitis, fibromuscular dysplasia or significant  stenosis. IMA: Patent without evidence of aneurysm, dissection, vasculitis or significant stenosis. Inflow: Patent without evidence of aneurysm, dissection, vasculitis or significant stenosis. Veins: Retroaortic left renal vein incidentally noted. Review of the MIP images confirms the above findings. NON-VASCULAR Hepatobiliary: Fatty infiltration of the liver is seen. No focal lesion. The gallbladder and biliary tree appear normal. Pancreas: Unremarkable. No pancreatic ductal dilatation or surrounding inflammatory changes. Spleen: Normal in size without focal abnormality. Adrenals/Urinary Tract: 0.4 cm nonobstructing stone lower pole left kidney is noted. The kidneys are otherwise unremarkable. Ureters and urinary bladder appear normal. Adrenal glands are normal. Stomach/Bowel: Stomach is within normal limits. Status post appendectomy. No evidence of bowel wall thickening, distention, or inflammatory changes. Lymphatic: Negative. Reproductive: Prostate is unremarkable. Other: Small fat containing umbilical hernia.  No ascites. Musculoskeletal: Negative. Review of the MIP images confirms the above findings. IMPRESSION: Negative for aortic dissection or aneurysm. No acute abnormality chest, abdomen or pelvis. No finding to explain the patient's symptoms. Fatty infiltration of the liver. 0.4 cm nonobstructing stone lower pole right kidney. Electronically Signed   By: Drusilla Kanner M.D.   On: 11/11/2017 11:56    Procedures Procedures (including critical care time)  Medications Ordered in ED Medications  ondansetron (ZOFRAN) injection 4 mg (4 mg Intravenous Given 11/11/17 1041)  morphine 4 MG/ML injection 4 mg (4 mg Intravenous Given 11/11/17 1041)  HYDROmorphone (DILAUDID) injection 1 mg (1 mg Intravenous Given 11/11/17 1123)  LORazepam (ATIVAN) injection 1 mg (1 mg Intravenous Given 11/11/17 1123)  diphenhydrAMINE (BENADRYL) injection 25 mg (25 mg Intravenous Given 11/11/17 1123)  iopamidol (ISOVUE-370) 76 %  injection 100 mL ( Intravenous Contrast Given 11/11/17 1134)     Initial Impression / Assessment and Plan / ED Course  I have reviewed the triage vital signs and the nursing notes.  Pertinent labs & imaging results that were available during my care of the patient were reviewed by me and considered in my medical decision making (see chart for details).     Severe chest pain however EKG x2 without ischemic changes.  Troponin x2- for any elevation.  D-dimer negative.  CT angios chest abdomen pelvis negative for dissection or other occult pathology.  Patient still with mild pain at time of discharge however I have a low suspicion that this is acute coronary syndrome given the multitude of tests run and all negative findings including constant chest pain since 9 AM with negative troponins here in the emergency department.  Atypical chest pain.  May be musculoskeletal nature.  No clear etiology.  I do not think he needs additional testing or management in the hospital at this time.  He is overall well-appearing.  Outpatient primary care and cardiology follow-up.  Patient understands to  return to the emergency department for new or worsening symptoms  Final Clinical Impressions(s) / ED Diagnoses   Final diagnoses:  None    ED Discharge Orders    None       Azalia Bilisampos, Treyce Spillers, MD 11/11/17 1537

## 2017-11-11 NOTE — ED Triage Notes (Signed)
Pt complaint of sudden onset left chest pain with associated left jaw/arm pain and nausea. Pt tremor/chills with triage. Verbalizes hx of heart attack and open heart surgery.

## 2017-11-24 ENCOUNTER — Ambulatory Visit: Payer: Self-pay | Admitting: Family Medicine

## 2017-11-24 ENCOUNTER — Encounter: Payer: Self-pay | Admitting: Family Medicine

## 2017-11-24 VITALS — BP 100/72 | HR 81 | Temp 98.4°F | Wt 218.4 lb

## 2017-11-24 DIAGNOSIS — H60339 Swimmer's ear, unspecified ear: Secondary | ICD-10-CM

## 2017-11-24 DIAGNOSIS — H6120 Impacted cerumen, unspecified ear: Secondary | ICD-10-CM

## 2017-11-24 MED ORDER — HYDROCORTISONE-ACETIC ACID 1-2 % OT SOLN: 3.0000 [drp] | Freq: Three times a day (TID) | OTIC | 0 refills | Status: DC

## 2017-11-24 NOTE — Patient Instructions (Signed)
Earwax Buildup, Adult The ears produce a substance called earwax that helps keep bacteria out of the ear and protects the skin in the ear canal. Occasionally, earwax can build up in the ear and cause discomfort or hearing loss. What increases the risk? This condition is more likely to develop in people who:  Are male.  Are elderly.  Naturally produce more earwax.  Clean their ears often with cotton swabs.  Use earplugs often.  Use in-ear headphones often.  Wear hearing aids.  Have narrow ear canals.  Have earwax that is overly thick or sticky.  Have eczema.  Are dehydrated.  Have excess hair in the ear canal.  What are the signs or symptoms? Symptoms of this condition include:  Reduced or muffled hearing.  A feeling of fullness in the ear or feeling that the ear is plugged.  Fluid coming from the ear.  Ear pain.  Ear itch.  Ringing in the ear.  Coughing.  An obvious piece of earwax that can be seen inside the ear canal.  How is this diagnosed? This condition may be diagnosed based on:  Your symptoms.  Your medical history.  An ear exam. During the exam, your health care provider will look into your ear with an instrument called an otoscope.  You may have tests, including a hearing test. How is this treated? This condition may be treated by:  Using ear drops to soften the earwax.  Having the earwax removed by a health care provider. The health care provider may: ? Flush the ear with water. ? Use an instrument that has a loop on the end (curette). ? Use a suction device.  Surgery to remove the wax buildup. This may be done in severe cases.  Follow these instructions at home:  Take over-the-counter and prescription medicines only as told by your health care provider.  Do not put any objects, including cotton swabs, into your ear. You can clean the opening of your ear canal with a washcloth or facial tissue.  Follow instructions from your health  care provider about cleaning your ears. Do not over-clean your ears.  Drink enough fluid to keep your urine clear or pale yellow. This will help to thin the earwax.  Keep all follow-up visits as told by your health care provider. If earwax builds up in your ears often or if you use hearing aids, consider seeing your health care provider for routine, preventive ear cleanings. Ask your health care provider how often you should schedule your cleanings.  If you have hearing aids, clean them according to instructions from the manufacturer and your health care provider. Contact a health care provider if:  You have ear pain.  You develop a fever.  You have blood, pus, or other fluid coming from your ear.  You have hearing loss.  You have ringing in your ears that does not go away.  Your symptoms do not improve with treatment.  You feel like the room is spinning (vertigo). Summary  Earwax can build up in the ear and cause discomfort or hearing loss.  The most common symptoms of this condition include reduced or muffled hearing and a feeling of fullness in the ear or feeling that the ear is plugged.  This condition may be diagnosed based on your symptoms, your medical history, and an ear exam.  This condition may be treated by using ear drops to soften the earwax or by having the earwax removed by a health care provider.  Do   not put any objects, including cotton swabs, into your ear. You can clean the opening of your ear canal with a washcloth or facial tissue. This information is not intended to replace advice given to you by your health care provider. Make sure you discuss any questions you have with your health care provider. Document Released: 07/04/2004 Document Revised: 08/07/2016 Document Reviewed: 08/07/2016 Elsevier Interactive Patient Education  2018 [employer]  

## 2017-11-24 NOTE — Progress Notes (Signed)
Kenneth BorneFrancis E Jones is a 47 y.o. male who presents today with concerns of muffled hearing and ear wax buildup for the last few days. He reports using OTC debrox last night without much relief.  Review of Systems  Constitutional: Negative for chills, fever and malaise/fatigue.  HENT: Positive for hearing loss. Negative for congestion, ear discharge, ear pain, sinus pain and sore throat.        Ear fullness  Eyes: Negative.   Respiratory: Negative for cough, sputum production and shortness of breath.   Cardiovascular: Negative.  Negative for chest pain.  Gastrointestinal: Negative for abdominal pain, diarrhea, nausea and vomiting.  Genitourinary: Negative for dysuria, frequency, hematuria and urgency.  Musculoskeletal: Negative for myalgias.  Skin: Negative.   Neurological: Negative for headaches.  Endo/Heme/Allergies: Negative.   Psychiatric/Behavioral: Negative.     O: Vitals:   11/24/17 1236  BP: 100/72  Pulse: 81  Temp: 98.4 F (36.9 C)  SpO2: 98%     Physical Exam  Constitutional: He is oriented to person, place, and time. Vital signs are normal. He appears well-developed and well-nourished. He is active.  Non-toxic appearance. He does not have a sickly appearance.  HENT:  Head: Normocephalic.  Right Ear: Hearing, tympanic membrane and external ear normal.  Left Ear: Hearing, tympanic membrane and external ear normal.  Nose: Nose normal.  Mouth/Throat: Uvula is midline and oropharynx is clear and moist.  Neck: Normal range of motion. Neck supple.  Cardiovascular: Normal rate, regular rhythm, normal heart sounds and normal pulses.  Pulmonary/Chest: Effort normal and breath sounds normal.  Abdominal: Soft. Bowel sounds are normal.  Musculoskeletal: Normal range of motion.  Lymphadenopathy:       Head (right side): No submental and no submandibular adenopathy present.       Head (left side): No submental and no submandibular adenopathy present.    He has no cervical  adenopathy.  Neurological: He is alert and oriented to person, place, and time.  Psychiatric: He has a normal mood and affect.  Vitals reviewed.  A: 1. Impacted cerumen, unspecified laterality   2. Swimmer's ear, unspecified chronicity, unspecified laterality    P: Ear wax removed via bilateral flush. Advised topical treatment. Exam findings, diagnosis etiology and medication use and indications reviewed with patient. Follow- Up and discharge instructions provided. No emergent/urgent issues found on exam.  Patient verbalized understanding of information provided and agrees with plan of care (POC), all questions answered.  1. Impacted cerumen, unspecified laterality Flush 2. Swimmer's ear, unspecified chronicity, unspecified laterality - acetic acid-hydrocortisone (VOSOL-HC) OTIC solution; Place 3 drops into both ears 3 (three) times daily. For 5 days

## 2018-02-18 ENCOUNTER — Ambulatory Visit: Payer: Self-pay | Admitting: Nurse Practitioner

## 2018-02-18 VITALS — BP 115/75 | HR 81 | Temp 98.7°F | Resp 18 | Wt 223.8 lb

## 2018-02-18 DIAGNOSIS — K047 Periapical abscess without sinus: Secondary | ICD-10-CM

## 2018-02-18 MED ORDER — AMOXICILLIN 500 MG PO CAPS: 500.0000 mg | ORAL_CAPSULE | Freq: Three times a day (TID) | ORAL | 0 refills | Status: AC

## 2018-02-18 NOTE — Progress Notes (Signed)
History of Present Illness   Patient Identification Kenneth Jones is a 47 y.o. male.  Patient information was obtained from patient. History/Exam limitations: none.   Chief Complaint  Adenopathy (X 3 DAYS, SWOLLEN RIGHT SIDE JAW); Ear Pain (X 3 DAYS, RIGHT EAR ); and Chills   Patient who presents with complaint of swollen lymph node and ear pain. Onset of symptoms was 3 days ago. Patient denies tooth pain, but does have pain in right ear, describes as aching.  The patient states he chipped his tooth on the right lower side about 3 days ago.  Patient states after that he developed swelling in the jaw, and pain in his right ear.  Pain severity now is mild. The pain does radiate. Patient has jaw swelling or fever >101. Pain is aggravated by palpation. The patient denies other complaints. Patient has not sought treatment by another care provider for this problem. Care prior to arrival consisted of tried Benadryl thinking it was his ear, with no relief.   Past Medical History:  Diagnosis Date  . Diabetes mellitus without complication (HCC)   . MI (myocardial infarction) (HCC)   . Seizures (HCC)    No family history on file. Scheduled Meds: Continuous Infusions: PRN Meds:  Allergies  Allergen Reactions  . Morphine And Related Shortness Of Breath and Itching   Social History   Socioeconomic History  . Marital status: Married    Spouse name: Not on file  . Number of children: Not on file  . Years of education: Not on file  . Highest education level: Not on file  Occupational History  . Not on file  Social Needs  . Financial resource strain: Not on file  . Food insecurity:    Worry: Not on file    Inability: Not on file  . Transportation needs:    Medical: Not on file    Non-medical: Not on file  Tobacco Use  . Smoking status: Current Every Day Smoker  . Smokeless tobacco: Never Used  Substance and Sexual Activity  . Alcohol use: Not on file  . Drug use: Not on file  .  Sexual activity: Not on file  Lifestyle  . Physical activity:    Days per week: Not on file    Minutes per session: Not on file  . Stress: Not on file  Relationships  . Social connections:    Talks on phone: Not on file    Gets together: Not on file    Attends religious service: Not on file    Active member of club or organization: Not on file    Attends meetings of clubs or organizations: Not on file    Relationship status: Not on file  . Intimate partner violence:    Fear of current or ex partner: Not on file    Emotionally abused: Not on file    Physically abused: Not on file    Forced sexual activity: Not on file  Other Topics Concern  . Not on file  Social History Narrative  . Not on file   Review of Systems Constitutional: positive for chills, negative for anorexia, fatigue, fevers and malaise Eyes: negative Ears, nose, mouth, throat, and face: positive for sore mouth and right ear pain and right jaw swelling, negative for earaches, hoarseness, nasal congestion and sore throat Respiratory: negative Cardiovascular: negative Gastrointestinal: negative Neurological: negative   Physical Exam   BP 115/75 (BP Location: Right Arm, Patient Position: Sitting, Cuff Size: Normal)  Pulse 81   Temp 98.7 F (37.1 C) (Oral)   Resp 18   Wt 223 lb 12.8 oz (101.5 kg)   SpO2 96%   Physical Exam  Constitutional: He appears well-developed and well-nourished. No distress.  HENT:  Head: Normocephalic and atraumatic.  Right Ear: External ear normal.  Left Ear: External ear normal.  Nose: Nose normal.  Mouth/Throat: Uvula is midline, oropharynx is clear and moist and mucous membranes are normal. Abnormal dentition. Dental abscesses (suspect dental abscess to lower right molar) and dental caries present. Tonsils are 0 on the right. Tonsils are 0 on the left.    Cardiovascular: Normal rate, regular rhythm and normal heart sounds.  Pulmonary/Chest: Effort normal and breath sounds  normal.  Abdominal: Soft. Bowel sounds are normal.  Lymphadenopathy:    He has cervical adenopathy (right).     Assessment and Plan:   Exam findings, diagnosis etiology and medication use and indications reviewed with patient. Follow- Up and discharge instructions provided. No emergent/urgent issues found on exam.  Patient education was provided. Patient verbalized understanding of information provided and agrees with plan of care (POC), all questions answered. The patient is advised to call or return to clinic if he does not see an improvement in symptoms, or to seek the care of the closest emergency department if he worsens with the above plan.    1. Dental abscess  - amoxicillin (AMOXIL) 500 MG capsule; Take 1 capsule (500 mg total) by mouth 3 (three) times daily for 10 days.  Dispense: 30 capsule; Refill: 0 -Take medication as prescribed. -Warm compresses to the right jaw for comfort. -Rinse in warm saltwater 3-4 times daily as needed. -Follow up with dentist as scheduled on 02/23/18. -Follow up as needed

## 2018-02-18 NOTE — Patient Instructions (Signed)
Dental Abscess -Take medication as prescribed. -Warm compresses to the right jaw for comfort. -Rinse in warm saltwater 3-4 times daily as needed. -Follow up with dentist as scheduled on 02/23/18. -Follow up as needed.   A dental abscess is a collection of pus in or around a tooth. What are the causes? This condition is caused by a bacterial infection around the root of the tooth that involves the inner part of the tooth (pulp). It may result from:  Severe tooth decay.  Trauma to the tooth that allows bacteria to enter into the pulp, such as a broken or chipped tooth.  Severe gum disease around a tooth.  What are the signs or symptoms? Symptoms of this condition include:  Severe pain in and around the infected tooth.  Swelling and redness around the infected tooth, in the mouth, or in the face.  Tenderness.  Pus drainage.  Bad breath.  Bitter taste in the mouth.  Difficulty swallowing.  Difficulty opening the mouth.  Nausea.  Vomiting.  Chills.  Swollen neck glands.  Fever.  How is this diagnosed? This condition is diagnosed with examination of the infected tooth. During the exam, your dentist may tap on the infected tooth. Your dentist will also ask about your medical and dental history and may order X-rays. How is this treated? This condition is treated by eliminating the infection. This may be done with:  Antibiotic medicine.  A root canal. This may be performed to save the tooth.  Pulling (extracting) the tooth. This may also involve draining the abscess. This is done if the tooth cannot be saved.  Follow these instructions at home:  Take medicines only as directed by your dentist.  If you were prescribed antibiotic medicine, finish all of it even if you start to feel better.  Rinse your mouth (gargle) often with salt water to relieve pain or swelling.  Do not drive or operate heavy machinery while taking pain medicine.  Do not apply heat to the  outside of your mouth.  Keep all follow-up visits as directed by your dentist. This is important. Contact a health care provider if:  Your pain is worse and is not helped by medicine. Get help right away if:  You have a fever or chills.  Your symptoms suddenly get worse.  You have a very bad headache.  You have problems breathing or swallowing.  You have trouble opening your mouth.  You have swelling in your neck or around your eye. This information is not intended to replace advice given to you by your health care provider. Make sure you discuss any questions you have with your health care provider. Document Released: 05/27/2005 Document Revised: 10/05/2015 Document Reviewed: 05/24/2014 Elsevier Interactive Patient Education  [employer].

## 2018-03-09 ENCOUNTER — Ambulatory Visit: Payer: Self-pay | Admitting: Adult Health

## 2018-03-09 ENCOUNTER — Ambulatory Visit (HOSPITAL_COMMUNITY)
Admission: EM | Admit: 2018-03-09 | Discharge: 2018-03-09 | Disposition: A | Discharge status: Other Acute Inpatient Hospital | Payer: Self-pay

## 2018-03-09 VITALS — BP 110/80 | HR 85 | Temp 98.3°F | Resp 20 | Wt 223.0 lb

## 2018-03-09 DIAGNOSIS — R05 Cough: Secondary | ICD-10-CM

## 2018-03-09 DIAGNOSIS — R059 Cough, unspecified: Secondary | ICD-10-CM

## 2018-03-09 DIAGNOSIS — Z951 Presence of aortocoronary bypass graft: Secondary | ICD-10-CM

## 2018-03-09 NOTE — Progress Notes (Addendum)
Subjective:     Patient ID: Kenneth Jones, male   DOB: Dec 02, 1970, 47 y.o.   MRN: [id number]  HPI   Blood pressure 110/80, pulse 85, temperature 98.3 F (36.8 C), temperature source Oral, resp. rate 20, weight 223 lb (101.2 kg), SpO2 98 %. Patient is a 47 year old male in no acute distress who comes to the clinic with cough that started last night, he has a barky cough and chest tightness. He has nasal congestion. He reports cough has been continuous since onset last pm. He reports he has Xopenex at home and has been using with mild improvement. Congested cough.   He has history of open heart surgery 2012. History of pneumonias in ICU at time of heart surgery  and reports pneumonias since. He reports always having chest tightness since the pneumonia he had in 2012 and that " I never healed right ". Denies chest pain or any radiating pain. From history review provider sees patient was seen in Emergency room in June for peri cordial pain. Denies edema.    Patient  denies any fever, body aches,chills, rash, chest pain, nausea, vomiting, or diarrhea.  Denies any recent flights, surgeries or travel.   Allergies  Allergen Reactions  . Morphine And Related Shortness Of Breath and Itching    Current Outpatient Medications:  .  acetaminophen (TYLENOL) 325 MG tablet, Take 650 mg by mouth every 6 (six) hours as needed., Disp: , Rfl:  .  aspirin EC 81 MG tablet, Take 81 mg by mouth daily., Disp: , Rfl:  .  clopidogrel (PLAVIX) 75 MG tablet, Take 75 mg by mouth daily., Disp: , Rfl:  .  Dextromethorphan-Menthol (DELSYM COUGH RELIEF) 5-5 MG LOZG, Use as directed in the mouth or throat., Disp: , Rfl:  .  diclofenac sodium (VOLTAREN) 1 % GEL, Apply 2 g topically 4 (four) times daily., Disp: , Rfl:  .  lamoTRIgine (LAMICTAL) 200 MG tablet, Take 400 mg by mouth daily., Disp: , Rfl:  .  lisinopril (PRINIVIL,ZESTRIL) 5 MG tablet, Take 5 mg by mouth daily., Disp: , Rfl:  .  metFORMIN (GLUCOPHAGE) 1000 MG  tablet, Take 1,000 mg by mouth daily., Disp: , Rfl:  .  metoprolol tartrate (LOPRESSOR) 25 MG tablet, Take 25 mg by mouth daily with supper., Disp: , Rfl:  .  Multiple Vitamin (MULTIVITAMIN WITH MINERALS) TABS tablet, Take 1 tablet by mouth daily., Disp: , Rfl:  .  PROAIR HFA 108 (90 Base) MCG/ACT inhaler, take 2 puffs 4 times a day, Disp: , Rfl: 2 .  tamsulosin (FLOMAX) 0.4 MG CAPS capsule, Take 0.4 mg by mouth daily., Disp: , Rfl:  .  acetic acid-hydrocortisone (VOSOL-HC) OTIC solution, Place 3 drops into both ears 3 (three) times daily. For 5 days (Patient not taking: Reported on 02/18/2018), Disp: 10 mL, Rfl: 0 .  fentaNYL (DURAGESIC - DOSED MCG/HR) 75 MCG/HR, Place 75 mcg onto the skin every 3 (three) days., Disp: , Rfl:  .  HYDROcodone-Acetaminophen 7.5-300 MG TABS, Take 1 tablet by mouth every 4 (four) hours as needed (PAIN)., Disp: , Rfl:  .  ibuprofen (ADVIL,MOTRIN) 200 MG tablet, Take 800 mg by mouth daily as needed for moderate pain., Disp: , Rfl:  .  oxyCODONE-acetaminophen (PERCOCET) 7.5-325 MG tablet, Take 1 tablet by mouth every 4 (four) hours as needed for severe pain., Disp: , Rfl:  Review of Systems  Constitutional: Positive for chills and fatigue. Negative for activity change, appetite change, diaphoresis, fever and unexpected weight change.  HENT: Positive for congestion. Negative for dental problem, drooling, ear discharge, ear pain, facial swelling, hearing loss, mouth sores, nosebleeds, postnasal drip, rhinorrhea, sinus pressure, sinus pain, sneezing, sore throat, tinnitus, trouble swallowing and voice change.   Eyes: Negative.   Respiratory: Positive for cough, chest tightness, shortness of breath (last night denies now after using xopenex at home ) and wheezing. Negative for apnea, choking and stridor.   Cardiovascular: Negative.  Negative for chest pain, palpitations and leg swelling.  Gastrointestinal: Negative.   Endocrine: Negative.   Genitourinary: Negative.    Musculoskeletal: Negative.   Skin: Negative.   Allergic/Immunologic: Negative.   Neurological: Negative.   Hematological: Negative.   Psychiatric/Behavioral: Negative.        Objective:   Physical Exam  Constitutional: He appears well-developed and well-nourished. He is active.  Non-toxic appearance. He does not have a sickly appearance. He appears ill. No distress. He is not intubated.  HENT:  Head: Normocephalic and atraumatic.  Neck: Trachea normal, normal range of motion, full passive range of motion without pain and phonation normal. Neck supple.  Cardiovascular: Normal rate, regular rhythm and normal heart sounds. Exam reveals no gallop and no friction rub.  No murmur heard. Pulmonary/Chest: Effort normal. No accessory muscle usage or stridor. No apnea, no tachypnea and no bradypnea. He is not intubated. No respiratory distress. Decreased breath sounds: right upper field decreased breath sounds  He has wheezes. He has no rales. He exhibits no tenderness.  Coughing intermittently between every fourth to fifth  word. Denies any acute distress states " I was worse last night when this first started".  Neurological: He is alert.  Skin: Skin is warm, dry and intact. Capillary refill takes less than 2 seconds. He is not diaphoretic. No cyanosis. Nails show no clubbing.  Psychiatric: He has a normal mood and affect. His speech is normal and behavior is normal. Judgment and thought content normal. Cognition and memory are normal.       Assessment:      Cough  Hx of CABG  Erroneous encounter - disregard    Plan:    After reviewing patients record and given history and onset of symptoms it is my best clinical judgement that he needs to be seen for higher level of care and radiology services and likely specialty consult in the hospital.  Patient was advised he needs higher level of care than available at the [facility] given history provider can not rule out DVT, Congestive heart  failure or other etiology and he needs further work up. Advised Emergency room now and not to self drive. Call 911 for any emergency. Offered 911 now if patient does not have driver- patient declined.  Patient verbalized understanding.

## 2018-04-08 ENCOUNTER — Ambulatory Visit: Payer: Self-pay | Admitting: Family Medicine

## 2018-04-08 ENCOUNTER — Encounter: Payer: Self-pay | Admitting: Family Medicine

## 2018-04-08 VITALS — BP 130/90 | HR 90 | Temp 98.8°F | Wt 215.0 lb

## 2018-04-08 DIAGNOSIS — J40 Bronchitis, not specified as acute or chronic: Secondary | ICD-10-CM

## 2018-04-08 DIAGNOSIS — R05 Cough: Secondary | ICD-10-CM

## 2018-04-08 DIAGNOSIS — R062 Wheezing: Secondary | ICD-10-CM

## 2018-04-08 DIAGNOSIS — R059 Cough, unspecified: Secondary | ICD-10-CM

## 2018-04-08 MED ORDER — DOXYCYCLINE HYCLATE 100 MG PO TABS: 100.0000 mg | ORAL_TABLET | Freq: Two times a day (BID) | ORAL | 0 refills | Status: DC

## 2018-04-08 MED ORDER — ALBUTEROL SULFATE HFA 108 (90 BASE) MCG/ACT IN AERS: 2.0000 | INHALATION_SPRAY | Freq: Four times a day (QID) | RESPIRATORY_TRACT | 0 refills | Status: AC | PRN

## 2018-04-08 MED ORDER — PREDNISONE 20 MG PO TABS: 60.0000 mg | ORAL_TABLET | Freq: Every day | ORAL | 0 refills | Status: DC

## 2018-04-08 MED ORDER — BENZONATATE 200 MG PO CAPS: 200.0000 mg | ORAL_CAPSULE | Freq: Three times a day (TID) | ORAL | 0 refills | Status: DC | PRN

## 2018-04-08 NOTE — Patient Instructions (Addendum)
Acute Bronchitis, Adult  Take medications as prescribed Follow up in 5 days Use resources provided to obtain primary care and pulmonology services.  Acute bronchitis is sudden (acute) swelling of the air tubes (bronchi) in the lungs. Acute bronchitis causes these tubes to fill with mucus, which can make it hard to breathe. It can also cause coughing or wheezing. In adults, acute bronchitis usually goes away within 2 weeks. A cough caused by bronchitis may last up to 3 weeks. Smoking, allergies, and asthma can make the condition worse. Repeated episodes of bronchitis may cause further lung problems, such as chronic obstructive pulmonary disease (COPD). What are the causes? This condition can be caused by germs and by substances that irritate the lungs, including:  Cold and flu viruses. This condition is most often caused by the same virus that causes a cold.  Bacteria.  Exposure to tobacco smoke, dust, fumes, and air pollution.  What increases the risk? This condition is more likely to develop in people who:  Have close contact with someone with acute bronchitis.  Are exposed to lung irritants, such as tobacco smoke, dust, fumes, and vapors.  Have a weak immune system.  Have a respiratory condition such as asthma.  What are the signs or symptoms? Symptoms of this condition include:  A cough.  Coughing up clear, yellow, or green mucus.  Wheezing.  Chest congestion.  Shortness of breath.  A fever.  Body aches.  Chills.  A sore throat.  How is this diagnosed? This condition is usually diagnosed with a physical exam. During the exam, your health care provider may order tests, such as chest X-rays, to rule out other conditions. He or she may also:  Test a sample of your mucus for bacterial infection.  Check the level of oxygen in your blood. This is done to check for pneumonia.  Do a chest X-ray or lung function testing to rule out pneumonia and other  conditions.  Perform blood tests.  Your health care provider will also ask about your symptoms and medical history. How is this treated? Most cases of acute bronchitis clear up over time without treatment. Your health care provider may recommend:  Drinking more fluids. Drinking more makes your mucus thinner, which may make it easier to breathe.  Taking a medicine for a fever or cough.  Taking an antibiotic medicine.  Using an inhaler to help improve shortness of breath and to control a cough.  Using a cool mist vaporizer or humidifier to make it easier to breathe.  Follow these instructions at home: Medicines  Take over-the-counter and prescription medicines only as told by your health care provider.  If you were prescribed an antibiotic, take it as told by your health care provider. Do not stop taking the antibiotic even if you start to feel better. General instructions  Get plenty of rest.  Drink enough fluids to keep your urine clear or pale yellow.  Avoid smoking and secondhand smoke. Exposure to cigarette smoke or irritating chemicals will make bronchitis worse. If you smoke and you need help quitting, ask your health care provider. Quitting smoking will help your lungs heal faster.  Use an inhaler, cool mist vaporizer, or humidifier as told by your health care provider.  Keep all follow-up visits as told by your health care provider. This is important. How is this prevented? To lower your risk of getting this condition again:  Wash your hands often with soap and water. If soap and water are not available,  use hand sanitizer.  Avoid contact with people who have cold symptoms.  Try not to touch your hands to your mouth, nose, or eyes.  Make sure to get the flu shot every year.  Contact a health care provider if:  Your symptoms do not improve in 2 weeks of treatment. Get help right away if:  You cough up blood.  You have chest pain.  You have severe shortness  of breath.  You become dehydrated.  You faint or keep feeling like you are going to faint.  You keep vomiting.  You have a severe headache.  Your fever or chills gets worse. This information is not intended to replace advice given to you by your health care provider. Make sure you discuss any questions you have with your health care provider. Document Released: 07/04/2004 Document Revised: 12/20/2015 Document Reviewed: 11/15/2015 Elsevier Interactive Patient Education  [employer].

## 2018-04-08 NOTE — Progress Notes (Signed)
Kenneth Jones is a 47 y.o. male who presents today with concerns of cough and congestion for the last 2 days. He reports a similar respiratory issue in the last 30 days and states that he was treated with antibiotics z-pack and that he reports while his symptoms improved he is unsure that he fully recovered. He is new to the area in the last few months and denies primary care provider. He has a positive history for CABG and cardiac etiology as well as chronic pain. Last episode of chest pain in 11/2017 earlier this year. He reports that he has used Xopenex historically but is unable to get a refill from the provider who put him on this medication due to not being seen in over 1 year.  He reports using over the counter medications for his symptoms with minimal relief.  Review of Systems  Constitutional: Positive for malaise/fatigue. Negative for chills and fever.  HENT: Positive for congestion and ear pain. Negative for ear discharge, sinus pain and sore throat.   Eyes: Negative.   Respiratory: Positive for cough and sputum production. Negative for shortness of breath.   Cardiovascular: Negative.  Negative for chest pain.  Gastrointestinal: Negative for abdominal pain, diarrhea, nausea and vomiting.  Genitourinary: Negative for dysuria, frequency, hematuria and urgency.  Musculoskeletal: Negative for myalgias.  Skin: Negative.   Neurological: Negative for headaches.  Endo/Heme/Allergies: Negative.   Psychiatric/Behavioral: Negative.     O: Vitals:   04/08/18 0938  BP: 130/90  Pulse: 90  Temp: 98.8 F (37.1 C)  SpO2: 97%     Physical Exam  Constitutional: He is oriented to person, place, and time. Vital signs are normal. He appears well-developed and well-nourished. He is active.  Non-toxic appearance. He does not have a sickly appearance.  HENT:  Head: Normocephalic.  Right Ear: Hearing, tympanic membrane, external ear and ear canal normal.  Left Ear: Hearing, external ear and ear  canal normal. Tympanic membrane is injected and erythematous.  Nose: Rhinorrhea present.  Mouth/Throat: Uvula is midline. Posterior oropharyngeal erythema present.  Neck: Normal range of motion. Neck supple.  Cardiovascular: Normal rate, regular rhythm, normal heart sounds and normal pulses.  Pulmonary/Chest: Effort normal. He has wheezes in the right upper field, the right lower field, the left upper field and the left lower field. He has rhonchi in the right upper field, the right middle field, the right lower field, the left upper field, the left middle field and the left lower field.  Abdominal: Soft. Bowel sounds are normal.  Musculoskeletal: Normal range of motion.  Lymphadenopathy:       Head (right side): No submental, no submandibular and no tonsillar adenopathy present.       Head (left side): No submental, no submandibular and no tonsillar adenopathy present.    He has no cervical adenopathy.  Neurological: He is alert and oriented to person, place, and time.  Psychiatric: He has a normal mood and affect.  Vitals reviewed.  A: 1. Bronchitis   2. Cough   3. Wheezing    P: Discussed exam findings, diagnosis etiology and medication use and indications reviewed with patient. Follow- Up and discharge instructions provided. No emergent/urgent issues found on exam.  Patient verbalized understanding of information provided and agrees with plan of care (POC), all questions answered.   Take medications as prescribed Follow up in 5 days Use resources provided to obtain primary care and pulmonology services.  1. Bronchitis - doxycycline (VIBRA-TABS) 100 MG tablet; Take 1  tablet (100 mg total) by mouth 2 (two) times daily.  2. Cough - benzonatate (TESSALON) 200 MG capsule; Take 1 capsule (200 mg total) by mouth 3 (three) times daily as needed.  3. Wheezing - predniSONE (DELTASONE) 20 MG tablet; Take 3 tablets (60 mg total) by mouth daily with breakfast for 5 days. - albuterol  (PROVENTIL HFA;VENTOLIN HFA) 108 (90 Base) MCG/ACT inhaler; Inhale 2 puffs into the lungs every 6 (six) hours as needed for wheezing or shortness of breath.

## 2018-04-10 ENCOUNTER — Telehealth: Payer: Self-pay

## 2018-04-10 NOTE — Telephone Encounter (Signed)
Patient did not answered the phone 

## 2018-04-12 ENCOUNTER — Other Ambulatory Visit: Payer: Self-pay

## 2018-04-12 ENCOUNTER — Encounter (HOSPITAL_COMMUNITY): Payer: Self-pay

## 2018-04-12 ENCOUNTER — Emergency Department (HOSPITAL_COMMUNITY)
Admission: EM | Admit: 2018-04-12 | Discharge: 2018-04-12 | Disposition: A | Discharge status: Home / Self Care | Payer: 59 | Attending: Emergency Medicine | Admitting: Emergency Medicine

## 2018-04-12 ENCOUNTER — Emergency Department (HOSPITAL_COMMUNITY): Payer: 59

## 2018-04-12 DIAGNOSIS — J209 Acute bronchitis, unspecified: Secondary | ICD-10-CM | POA: Insufficient documentation

## 2018-04-12 DIAGNOSIS — Z79899 Other long term (current) drug therapy: Secondary | ICD-10-CM | POA: Insufficient documentation

## 2018-04-12 DIAGNOSIS — R05 Cough: Secondary | ICD-10-CM | POA: Insufficient documentation

## 2018-04-12 DIAGNOSIS — I252 Old myocardial infarction: Secondary | ICD-10-CM | POA: Diagnosis not present

## 2018-04-12 DIAGNOSIS — F17210 Nicotine dependence, cigarettes, uncomplicated: Secondary | ICD-10-CM | POA: Insufficient documentation

## 2018-04-12 DIAGNOSIS — Z951 Presence of aortocoronary bypass graft: Secondary | ICD-10-CM | POA: Insufficient documentation

## 2018-04-12 DIAGNOSIS — R0602 Shortness of breath: Secondary | ICD-10-CM | POA: Diagnosis present

## 2018-04-12 LAB — CBC WITH DIFFERENTIAL/PLATELET
Abs Immature Granulocytes: 0.25 10*3/uL — ABNORMAL HIGH (ref 0.00–0.07)
BASOS ABS: 0.1 10*3/uL (ref 0.0–0.1)
Basophils Relative: 1 %
EOS ABS: 0 10*3/uL (ref 0.0–0.5)
Eosinophils Relative: 0 %
HEMATOCRIT: 48.9 % (ref 39.0–52.0)
HEMOGLOBIN: 17.1 g/dL — AB (ref 13.0–17.0)
Immature Granulocytes: 2 %
LYMPHS PCT: 16 %
Lymphs Abs: 2.5 10*3/uL (ref 0.7–4.0)
MCH: 31.5 pg (ref 26.0–34.0)
MCHC: 35 g/dL (ref 30.0–36.0)
MCV: 90.1 fL (ref 80.0–100.0)
MONO ABS: 0.6 10*3/uL (ref 0.1–1.0)
MONOS PCT: 4 %
Neutro Abs: 12.3 10*3/uL — ABNORMAL HIGH (ref 1.7–7.7)
Neutrophils Relative %: 77 %
Platelets: 231 10*3/uL (ref 150–400)
RBC: 5.43 MIL/uL (ref 4.22–5.81)
RDW: 12.5 % (ref 11.5–15.5)
WBC: 15.7 10*3/uL — ABNORMAL HIGH (ref 4.0–10.5)
nRBC: 0 % (ref 0.0–0.2)

## 2018-04-12 LAB — BASIC METABOLIC PANEL
Anion gap: 13 (ref 5–15)
BUN: 16 mg/dL (ref 6–20)
CALCIUM: 8.8 mg/dL — AB (ref 8.9–10.3)
CHLORIDE: 102 mmol/L (ref 98–111)
CO2: 22 mmol/L (ref 22–32)
Creatinine, Ser: 0.98 mg/dL (ref 0.61–1.24)
GFR calc non Af Amer: >60 mL/min (ref >60)
GLUCOSE: 438 mg/dL — AB (ref 70–99)
Potassium: 3.9 mmol/L (ref 3.5–5.1)
Sodium: 137 mmol/L (ref 135–145)

## 2018-04-12 LAB — I-STAT TROPONIN, ED: Troponin i, poc: 0 ng/mL (ref 0.00–0.08)

## 2018-04-12 MED ORDER — BENZONATATE 100 MG PO CAPS
100.0000 mg | ORAL_CAPSULE | Freq: Once | ORAL | Status: AC
  Administered 2018-04-12: 100 mg via ORAL
  Filled 2018-04-12: qty 1

## 2018-04-12 MED ORDER — PREDNISONE 10 MG PO TABS: ORAL_TABLET | ORAL | 0 refills | Status: AC

## 2018-04-12 MED ORDER — IPRATROPIUM-ALBUTEROL 0.5-2.5 (3) MG/3ML IN SOLN
3.0000 mL | Freq: Once | RESPIRATORY_TRACT | Status: AC
  Administered 2018-04-12: 3 mL via RESPIRATORY_TRACT
  Filled 2018-04-12: qty 3

## 2018-04-12 MED ORDER — ALBUTEROL SULFATE (5 MG/ML) 0.5% IN NEBU: 2.5000 mg | INHALATION_SOLUTION | Freq: Four times a day (QID) | RESPIRATORY_TRACT | 0 refills | Status: AC | PRN

## 2018-04-12 MED ORDER — ALBUTEROL SULFATE (2.5 MG/3ML) 0.083% IN NEBU
5.0000 mg | INHALATION_SOLUTION | Freq: Once | RESPIRATORY_TRACT | Status: AC
  Administered 2018-04-12: 5 mg via RESPIRATORY_TRACT
  Filled 2018-04-12: qty 6

## 2018-04-12 NOTE — ED Notes (Signed)
Bed: WTR9 Expected date:  Expected time:  Means of arrival:  Comments: 

## 2018-04-12 NOTE — ED Provider Notes (Signed)
Kenneth Jones Provider Note   CSN: [id number] Arrival date & time: 04/12/18  1159     History   Chief Complaint Chief Complaint  Patient presents with  . Shortness of Breath  . Cough    HPI Kenneth Jones is a 47 y.o. male.  The history is provided by the patient. No language interpreter was used.  Shortness of Breath  Associated symptoms include cough.  Cough  Associated symptoms include shortness of breath.   Kenneth Jones is a 47 y.o. male who presents to the Emergency Department complaining of sob. He has a history of coronary artery disease status post cabbage in 2012, diabetes, seizure disorder. He presents for evaluation of cough and shortness of breath. He initially became sick one month ago with cough productive of sputum. She was evaluated in urgent care at that time it was treated with a Z pack as well as penicillin. His symptoms transiently improved only to worsen about a week ago. He was seen in urgent care again and started on doxycycline, prednisone, Tesla pearls and Mucinex DM. He has been compliant with medications. He reports a temperature to 99.5 one week ago. His cough is productive of yellow and green sputum. He has associated nasal congestion, sore throat, chest soreness from coughing. No vomiting, diarrhea, abdominal pain, leg swelling or pain. He denies any history of lung disease but he does have an albuterol inhaler to use as needed. He does have mild improvement in his symptoms with his inhaler. He smokes half a pack per day. Symptoms are moderate and worsening in nature. Past Medical History:  Diagnosis Date  . Diabetes mellitus without complication (HCC)   . MI (myocardial infarction) (HCC)   . Seizures [facility])     Patient Active Problem List   Diagnosis Date Noted  . 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    Past Surgical History:  Procedure Laterality Date  . CARDIAC SURGERY          Home Medications    Prior to  Admission medications   Medication Sig Start Date End Date Taking? Authorizing Provider  acetaminophen (TYLENOL) 325 MG tablet Take 650 mg by mouth every 6 (six) hours as needed for moderate pain.    Yes [provider]  albuterol (PROVENTIL HFA;VENTOLIN HFA) 108 (90 Base) MCG/ACT inhaler Inhale 2 puffs into the lungs every 6 (six) hours as needed for wheezing or shortness of breath. 04/08/18  Yes Zachery Dauer, NP  aspirin EC 81 MG tablet Take 81 mg by mouth daily.   Yes [provider]  benzonatate (TESSALON) 200 MG capsule Take 1 capsule (200 mg total) by mouth 3 (three) times daily as needed. 04/08/18  Yes Zachery Dauer, NP  clopidogrel (PLAVIX) 75 MG tablet Take 75 mg by mouth daily.   Yes [provider]  dextromethorphan-guaiFENesin (MUCINEX DM) 30-600 MG 12hr tablet Take 1 tablet by mouth 2 (two) times daily as needed for cough.   Yes [provider]  diclofenac sodium (VOLTAREN) 1 % GEL Apply 2 g topically 4 (four) times daily as needed (pain).    Yes [provider]  doxycycline (VIBRA-TABS) 100 MG tablet Take 1 tablet (100 mg total) by mouth 2 (two) times daily. 04/08/18  Yes Zachery Dauer, NP  ibuprofen (ADVIL,MOTRIN) 200 MG tablet Take 800 mg by mouth daily as needed for moderate pain.   Yes [provider]  lamoTRIgine (LAMICTAL) 200 MG tablet Take 400 mg by mouth at bedtime.  Yes [provider]  lisinopril (PRINIVIL,ZESTRIL) 5 MG tablet Take 5 mg by mouth daily.   Yes [provider]  metFORMIN (GLUCOPHAGE) 1000 MG tablet Take 1,000 mg by mouth daily.   Yes [provider]  metoprolol tartrate (LOPRESSOR) 25 MG tablet Take 25 mg by mouth daily with supper.   Yes [provider]  Multiple Vitamin (MULTIVITAMIN WITH MINERALS) TABS tablet Take 1 tablet by mouth daily.   Yes [provider]  oxyCODONE (OXYCONTIN) 20 mg 12 hr tablet Take 20 mg by mouth every 12 (twelve) hours.   Yes  [provider]  oxyCODONE-acetaminophen (PERCOCET) 7.5-325 MG tablet Take 1 tablet by mouth every 6 (six) hours as needed for severe pain.    Yes [provider]  pseudoephedrine (SUDAFED) 120 MG 12 hr tablet Take 120 mg by mouth daily as needed for congestion.   Yes [provider]  simvastatin (ZOCOR) 80 MG tablet Take 80 mg by mouth at bedtime.   Yes [provider]  tamsulosin (FLOMAX) 0.4 MG CAPS capsule Take 0.4 mg by mouth daily.   Yes [provider]  acetic acid-hydrocortisone (VOSOL-HC) OTIC solution Place 3 drops into both ears 3 (three) times daily. For 5 days Patient not taking: Reported on 04/12/2018 11/24/17   Zachery Dauer, NP  albuterol (PROVENTIL) (5 MG/ML) 0.5% nebulizer solution Take 0.5 mLs (2.5 mg total) by nebulization every 6 (six) hours as needed for wheezing or shortness of breath. 04/12/18   Tilden Fossa, MD  predniSONE (DELTASONE) 10 MG tablet Take 4 tablets (40 mg total) by mouth daily for 3 days, THEN 3 tablets (30 mg total) daily for 3 days, THEN 2 tablets (20 mg total) daily for 3 days, THEN 1 tablet (10 mg total) daily for 3 days. 04/13/18 04/25/18  Tilden Fossa, MD    Family History History reviewed. No pertinent family history.  Social History Social History   Tobacco Use  . Smoking status: Current Every Day Smoker  . Smokeless tobacco: Never Used  Substance Use Topics  . Alcohol use: Not Currently  . Drug use: Not Currently     Allergies   Morphine and related   Review of Systems Review of Systems  Respiratory: Positive for cough and shortness of breath.   All other systems reviewed and are negative.    Physical Exam Updated Vital Signs BP 136/77 (BP Location: Right Arm)   Pulse 96   Temp 98.8 F (37.1 C) (Oral)   Resp 19   Ht 5\' 10"  (1.778 m)   SpO2 97%   BMI 30.85 kg/m   Physical Exam  Constitutional: He is oriented to person, place, and time. He appears well-developed and  well-nourished.  HENT:  Head: Normocephalic and atraumatic.  Cardiovascular: Normal rate and regular rhythm.  No murmur heard. Pulmonary/Chest: Effort normal. No respiratory distress.  Diffuse wheezes bilaterally. Frequent coughing.  Abdominal: Soft. There is no tenderness. There is no rebound and no guarding.  Musculoskeletal: He exhibits no edema or tenderness.  Neurological: He is alert and oriented to person, place, and time.  Skin: Skin is warm and dry.  Psychiatric: He has a normal mood and affect. His behavior is normal.  Nursing note and vitals reviewed.    ED Treatments / Results  Labs (all labs ordered are listed, but only abnormal results are displayed) Labs Reviewed  BASIC METABOLIC PANEL - Abnormal; Notable for the following components:      Result Value   Glucose, Bld 438 (*)  Calcium 8.8 (*)    All other components within normal limits  CBC WITH DIFFERENTIAL/PLATELET - Abnormal; Notable for the following components:   WBC 15.7 (*)    Hemoglobin 17.1 (*)    Neutro Abs 12.3 (*)    Abs Immature Granulocytes 0.25 (*)    All other components within normal limits  I-STAT TROPONIN, ED    EKG None  Radiology Dg Chest 2 View  Result Date: 04/12/2018 CLINICAL DATA:  Productive cough and worsening shortness of breath. EXAM: CHEST - 2 VIEW COMPARISON:  11/11/2017 FINDINGS: Stable postsurgical changes from median sternotomy. Cardiomediastinal silhouette is normal. Mediastinal contours appear intact. There is no evidence of focal airspace consolidation, pleural effusion or pneumothorax. Mild peribronchial thickening with central predominance. Osseous structures are without acute abnormality. Posttraumatic or arthritic changes at the right acromioclavicular joint. Soft tissues are grossly normal. IMPRESSION: Mild peribronchial thickening with central predominance usually associated with acute bronchitis. Electronically Signed   By: Ted Mcalpine M.D.   On: 04/12/2018  14:13    Procedures Procedures (including critical care time)  Medications Ordered in ED Medications  albuterol (PROVENTIL) (2.5 MG/3ML) 0.083% nebulizer solution 5 mg (5 mg Nebulization Given 04/12/18 1237)  ipratropium-albuterol (DUONEB) 0.5-2.5 (3) MG/3ML nebulizer solution 3 mL (3 mLs Nebulization Given 04/12/18 1355)  benzonatate (TESSALON) capsule 100 mg (100 mg Oral Given 04/12/18 1355)     Initial Impression / Assessment and Plan / ED Course  I have reviewed the triage vital signs and the nursing notes.  Pertinent labs & imaging results that were available during my care of the patient were reviewed by me and considered in my medical decision making (see chart for details).     Patient here for evaluation of one month of progressive cough and shortness of breath. He does have wheezing on initial evaluation, improved after albuterol treatments. He has no respiratory distress. He has been treated with two courses of antibiotics as well as steroid burst with last dose being this morning. Presentation is not consistent with ACS, CHF, pneumonia, acute respiratory failure, PE. CBC demonstrates leukocytosis and this is likely steam ordination secondary to steroid administration. He does have hyperglycemia on his BMP, takes metformin. Following treatment in the department he is feeling improved. Discussed with patient home care for bronchitis with reactive airway. Recommend continuing albuterol treatments, will do a longer steroid taper. Discussed outpatient follow-up as well as return precautions. Discussed hyperglycemia with oral fluid hydration. Discussed smoking cessation. Final Clinical Impressions(s) / ED Diagnoses   Final diagnoses:  Acute bronchitis, unspecified organism    ED Discharge Orders         Ordered    predniSONE (DELTASONE) 10 MG tablet     04/12/18 1524    albuterol (PROVENTIL) (5 MG/ML) 0.5% nebulizer solution  Every 6 hours PRN     04/12/18 1524             Tilden Fossa, MD 04/12/18 1623

## 2018-04-12 NOTE — ED Triage Notes (Signed)
Pt states that pt reports that he has had a chest cold x 1 month. Pt report that he was on a round of ABX and steroids but feels like colds is getting worse. Pt does reports that he has had open heart surgery 2012 and since then gets really bad colds. Pt has a productive cough and worsening SOB.

## 2018-04-12 NOTE — ED Notes (Signed)
Patient transported to X-ray 

## 2018-04-20 ENCOUNTER — Encounter (HOSPITAL_COMMUNITY): Payer: Self-pay | Admitting: Internal Medicine

## 2018-04-20 ENCOUNTER — Emergency Department (HOSPITAL_COMMUNITY): Payer: 59

## 2018-04-20 ENCOUNTER — Emergency Department (HOSPITAL_COMMUNITY)
Admission: EM | Admit: 2018-04-20 | Discharge: 2018-04-20 | Disposition: A | Discharge status: Home / Self Care | Payer: 59 | Attending: Emergency Medicine | Admitting: Emergency Medicine

## 2018-04-20 DIAGNOSIS — F17210 Nicotine dependence, cigarettes, uncomplicated: Secondary | ICD-10-CM | POA: Insufficient documentation

## 2018-04-20 DIAGNOSIS — R072 Precordial pain: Secondary | ICD-10-CM

## 2018-04-20 DIAGNOSIS — E119 Type 2 diabetes mellitus without complications: Secondary | ICD-10-CM | POA: Diagnosis not present

## 2018-04-20 DIAGNOSIS — Z7984 Long term (current) use of oral hypoglycemic drugs: Secondary | ICD-10-CM | POA: Diagnosis not present

## 2018-04-20 DIAGNOSIS — Z79899 Other long term (current) drug therapy: Secondary | ICD-10-CM | POA: Insufficient documentation

## 2018-04-20 DIAGNOSIS — R079 Chest pain, unspecified: Secondary | ICD-10-CM | POA: Diagnosis present

## 2018-04-20 LAB — COMPREHENSIVE METABOLIC PANEL
ALT: 43 U/L (ref 0–44)
AST: 37 U/L (ref 15–41)
Albumin: 3.8 g/dL (ref 3.5–5.0)
Alkaline Phosphatase: 104 U/L (ref 38–126)
Anion gap: 11 (ref 5–15)
BILIRUBIN TOTAL: 0.7 mg/dL (ref 0.3–1.2)
BUN: 11 mg/dL (ref 6–20)
CALCIUM: 9 mg/dL (ref 8.9–10.3)
CO2: 23 mmol/L (ref 22–32)
CREATININE: 0.94 mg/dL (ref 0.61–1.24)
Chloride: 100 mmol/L (ref 98–111)
GFR calc Af Amer: >60 mL/min (ref >60)
GFR calc non Af Amer: >60 mL/min (ref >60)
Glucose, Bld: 395 mg/dL — ABNORMAL HIGH (ref 70–99)
Potassium: 4.2 mmol/L (ref 3.5–5.1)
SODIUM: 134 mmol/L — AB (ref 135–145)
TOTAL PROTEIN: 6.6 g/dL (ref 6.5–8.1)

## 2018-04-20 LAB — CBC
HEMATOCRIT: 50 % (ref 39.0–52.0)
HEMOGLOBIN: 17.1 g/dL — AB (ref 13.0–17.0)
MCH: 31.8 pg (ref 26.0–34.0)
MCHC: 34.2 g/dL (ref 30.0–36.0)
MCV: 92.9 fL (ref 80.0–100.0)
NRBC: 0 % (ref 0.0–0.2)
Platelets: 190 10*3/uL (ref 150–400)
RBC: 5.38 MIL/uL (ref 4.22–5.81)
RDW: 13.1 % (ref 11.5–15.5)
WBC: 16.1 10*3/uL — AB (ref 4.0–10.5)

## 2018-04-20 LAB — I-STAT TROPONIN, ED: Troponin i, poc: 0 ng/mL (ref 0.00–0.08)

## 2018-04-20 LAB — D-DIMER, QUANTITATIVE (NOT AT ARMC): D DIMER QUANT: 0.3 ug{FEU}/mL (ref 0.00–0.50)

## 2018-04-20 MED ORDER — HYDROMORPHONE HCL 1 MG/ML IJ SOLN
1.0000 mg | Freq: Once | INTRAMUSCULAR | Status: AC
  Administered 2018-04-20: 1 mg via INTRAVENOUS
  Filled 2018-04-20: qty 1

## 2018-04-20 MED ORDER — ONDANSETRON HCL 4 MG/2ML IJ SOLN
4.0000 mg | Freq: Once | INTRAMUSCULAR | Status: AC
  Administered 2018-04-20: 4 mg via INTRAVENOUS
  Filled 2018-04-20: qty 2

## 2018-04-20 MED ORDER — ALUM & MAG HYDROXIDE-SIMETH 200-200-20 MG/5ML PO SUSP
15.0000 mL | Freq: Once | ORAL | Status: DC
  Filled 2018-04-20: qty 30

## 2018-04-20 MED ORDER — OXYCODONE-ACETAMINOPHEN 5-325 MG PO TABS
2.0000 | ORAL_TABLET | Freq: Once | ORAL | Status: AC
  Administered 2018-04-20: 2 via ORAL
  Filled 2018-04-20: qty 2

## 2018-04-20 MED ORDER — FAMOTIDINE 20 MG PO TABS
20.0000 mg | ORAL_TABLET | Freq: Once | ORAL | Status: AC
  Administered 2018-04-20: 20 mg via ORAL
  Filled 2018-04-20: qty 1

## 2018-04-20 NOTE — ED Notes (Signed)
Pt wants to leave AMA. Dr. Denton Lank notified. IV discontinued

## 2018-04-20 NOTE — ED Provider Notes (Addendum)
[facility] EMERGENCY DEPARTMENT Provider Note   CSN: [id number] Arrival date & time: 04/20/18  1123     History   Chief Complaint Chief Complaint  Patient presents with  . Chest Pain    HPI Kenneth Jones is a 47 y.o. male.  Patient w hx cad/cabg 2012 [facility], [place]), subsequent stent 2015, c/o mid chest pain at rest since very early this AM. Pain constant, dull, mod-severe, non radiating. Unsure if same as prior cardiac pain. No sob, nv or diaphoresis. Has taken asa today. ntg x 2 did not help pain. Notes recent dx bronchitis/cough, but notes symptoms improving. Denies fever. No leg pain or swelling. No hx dvt or pe. Denies heartburn. Denies other recent exertional cp or discomfort. No unusual doe. Pt also notes does have chronic pain/chronic cp as well, and takes meds for that at baseline - states this relates from his prior cabg 'not healing right'.  The history is provided by the patient.  Chest Pain   Pertinent negatives include no abdominal pain, no back pain, no fever, no headaches, no palpitations, no shortness of breath and no vomiting.    Past Medical History:  Diagnosis Date  . Diabetes mellitus without complication (HCC)   . MI (myocardial infarction) (HCC)   . Seizures [facility])     Patient Active Problem List   Diagnosis Date Noted  . 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    Past Surgical History:  Procedure Laterality Date  . CARDIAC SURGERY          Home Medications    Prior to Admission medications   Medication Sig Start Date End Date Taking? Authorizing Provider  acetaminophen (TYLENOL) 325 MG tablet Take 650 mg by mouth every 6 (six) hours as needed for moderate pain.     [provider]  acetic acid-hydrocortisone (VOSOL-HC) OTIC solution Place 3 drops into both ears 3 (three) times daily. For 5 days Patient not taking: Reported on 04/12/2018 11/24/17   Zachery Dauer, NP  albuterol (PROVENTIL HFA;VENTOLIN HFA) 108 (90 Base) MCG/ACT  inhaler Inhale 2 puffs into the lungs every 6 (six) hours as needed for wheezing or shortness of breath. 04/08/18   Zachery Dauer, NP  albuterol (PROVENTIL) (5 MG/ML) 0.5% nebulizer solution Take 0.5 mLs (2.5 mg total) by nebulization every 6 (six) hours as needed for wheezing or shortness of breath. 04/12/18   Tilden Fossa, MD  aspirin EC 81 MG tablet Take 81 mg by mouth daily.    [provider]  benzonatate (TESSALON) 200 MG capsule Take 1 capsule (200 mg total) by mouth 3 (three) times daily as needed. 04/08/18   Zachery Dauer, NP  clopidogrel (PLAVIX) 75 MG tablet Take 75 mg by mouth daily.    [provider]  dextromethorphan-guaiFENesin (MUCINEX DM) 30-600 MG 12hr tablet Take 1 tablet by mouth 2 (two) times daily as needed for cough.    [provider]  diclofenac sodium (VOLTAREN) 1 % GEL Apply 2 g topically 4 (four) times daily as needed (pain).     [provider]  doxycycline (VIBRA-TABS) 100 MG tablet Take 1 tablet (100 mg total) by mouth 2 (two) times daily. 04/08/18   Zachery Dauer, NP  ibuprofen (ADVIL,MOTRIN) 200 MG tablet Take 800 mg by mouth daily as needed for moderate pain.    [provider]  lamoTRIgine (LAMICTAL) 200 MG tablet Take 400 mg by mouth at bedtime.     [provider]  lisinopril (PRINIVIL,ZESTRIL) 5 MG  tablet Take 5 mg by mouth daily.    [provider]  metFORMIN (GLUCOPHAGE) 1000 MG tablet Take 1,000 mg by mouth daily.    [provider]  metoprolol tartrate (LOPRESSOR) 25 MG tablet Take 25 mg by mouth daily with supper.    [provider]  Multiple Vitamin (MULTIVITAMIN WITH MINERALS) TABS tablet Take 1 tablet by mouth daily.    [provider]  oxyCODONE (OXYCONTIN) 20 mg 12 hr tablet Take 20 mg by mouth every 12 (twelve) hours.    [provider]  oxyCODONE-acetaminophen (PERCOCET) 7.5-325 MG tablet Take 1 tablet by mouth every 6 (six) hours as needed for severe  pain.     [provider]  predniSONE (DELTASONE) 10 MG tablet Take 4 tablets (40 mg total) by mouth daily for 3 days, THEN 3 tablets (30 mg total) daily for 3 days, THEN 2 tablets (20 mg total) daily for 3 days, THEN 1 tablet (10 mg total) daily for 3 days. 04/13/18 04/25/18  Tilden Fossa, MD  pseudoephedrine (SUDAFED) 120 MG 12 hr tablet Take 120 mg by mouth daily as needed for congestion.    [provider]  simvastatin (ZOCOR) 80 MG tablet Take 80 mg by mouth at bedtime.    [provider]  tamsulosin (FLOMAX) 0.4 MG CAPS capsule Take 0.4 mg by mouth daily.    [provider]    Family History No family history on file.  Social History Social History   Tobacco Use  . Smoking status: Current Every Day Smoker  . Smokeless tobacco: Never Used  Substance Use Topics  . Alcohol use: Not Currently  . Drug use: Not Currently     Allergies   Morphine and related   Review of Systems Review of Systems  Constitutional: Negative for fever.  HENT: Negative for sore throat.   Eyes: Negative for redness.  Respiratory: Negative for shortness of breath.   Cardiovascular: Positive for chest pain. Negative for palpitations and leg swelling.  Gastrointestinal: Negative for abdominal pain and vomiting.  Genitourinary: Negative for flank pain.  Musculoskeletal: Negative for back pain and neck pain.  Skin: Negative for rash.  Neurological: Negative for headaches.  Hematological: Does not bruise/bleed easily.  Psychiatric/Behavioral: Negative for confusion.     Physical Exam Updated Vital Signs Ht 1.778 m (5\' 10" )   Wt 99.8 kg   BMI 31.57 kg/m   Physical Exam  Constitutional: He appears well-developed and well-nourished.  HENT:  Mouth/Throat: Oropharynx is clear and moist.  Eyes: Conjunctivae are normal.  Neck: Neck supple. No tracheal deviation present.  Cardiovascular: Normal rate, regular rhythm, normal heart sounds and intact distal pulses.  Exam reveals no gallop and no friction rub.  No murmur heard. Pulmonary/Chest: Effort normal and breath sounds normal. No accessory muscle usage. No respiratory distress.  Abdominal: Soft. Bowel sounds are normal. He exhibits no distension and no mass. There is no tenderness. There is no guarding.  Genitourinary:  Genitourinary Comments: No cva tenderness.   Musculoskeletal: He exhibits no edema or tenderness.  Neurological: He is alert.  Skin: Skin is warm and dry. No rash noted.  Psychiatric: He has a normal mood and affect.  Nursing note and vitals reviewed.    ED Treatments / Results  Labs (all labs ordered are listed, but only abnormal results are displayed) Results for orders placed or performed during the hospital encounter of 04/20/18  D-dimer, quantitative (not at [facility])  Result Value Ref Range   D-Dimer, Quant  0.30 0.00 - 0.50 ug/mL-FEU  CBC  Result Value Ref Range   WBC 16.1 (H) 4.0 - 10.5 K/uL   RBC 5.38 4.22 - 5.81 MIL/uL   Hemoglobin 17.1 (H) 13.0 - 17.0 g/dL   HCT 16.1 09.6 - 04.5 %   MCV 92.9 80.0 - 100.0 fL   MCH 31.8 26.0 - 34.0 pg   MCHC 34.2 30.0 - 36.0 g/dL   RDW 40.9 81.1 - 91.4 %   Platelets 190 150 - 400 K/uL   nRBC 0.0 0.0 - 0.2 %  Comprehensive metabolic panel  Result Value Ref Range   Sodium 134 (L) 135 - 145 mmol/L   Potassium 4.2 3.5 - 5.1 mmol/L   Chloride 100 98 - 111 mmol/L   CO2 23 22 - 32 mmol/L   Glucose, Bld 395 (H) 70 - 99 mg/dL   BUN 11 6 - 20 mg/dL   Creatinine, Ser 7.82 0.61 - 1.24 mg/dL   Calcium 9.0 8.9 - 95.6 mg/dL   Total Protein 6.6 6.5 - 8.1 g/dL   Albumin 3.8 3.5 - 5.0 g/dL   AST 37 15 - 41 U/L   ALT 43 0 - 44 U/L   Alkaline Phosphatase 104 38 - 126 U/L   Total Bilirubin 0.7 0.3 - 1.2 mg/dL   GFR calc non Af Amer >60 >60 mL/min   GFR calc Af Amer >60 >60 mL/min   Anion gap 11 5 - 15  I-stat troponin, ED  Result Value Ref Range   Troponin i, poc 0.00 0.00 - 0.08 ng/mL   Comment 3           Dg Chest 2 View  Result  Date: 04/12/2018 CLINICAL DATA:  Productive cough and worsening shortness of breath. EXAM: CHEST - 2 VIEW COMPARISON:  11/11/2017 FINDINGS: Stable postsurgical changes from median sternotomy. Cardiomediastinal silhouette is normal. Mediastinal contours appear intact. There is no evidence of focal airspace consolidation, pleural effusion or pneumothorax. Mild peribronchial thickening with central predominance. Osseous structures are without acute abnormality. Posttraumatic or arthritic changes at the right acromioclavicular joint. Soft tissues are grossly normal. IMPRESSION: Mild peribronchial thickening with central predominance usually associated with acute bronchitis. Electronically Signed   By: Ted Mcalpine M.D.   On: 04/12/2018 14:13   Dg Chest Port 1 View  Result Date: 04/20/2018 CLINICAL DATA:  Chest pain EXAM: PORTABLE CHEST 1 VIEW COMPARISON:  04/12/2018 FINDINGS: Cardiac shadows within normal limits. Postsurgical changes are noted. The lungs are well aerated bilaterally. No focal infiltrate is seen. No bony abnormality is noted. IMPRESSION: No active disease. Electronically Signed   By: Alcide Clever M.D.   On: 04/20/2018 12:18    EKG EKG Interpretation  Date/Time:  Monday April 20 2018 11:30:28 EST Ventricular Rate:  112 PR Interval:    QRS Duration: 96 QT Interval:  322 QTC Calculation: 438 R Axis:   136 Text Interpretation:  Sinus tachycardia No significant change since last tracing Confirmed by Cathren Laine ([id number]) on 04/20/2018 11:33:36 AM   Radiology Dg Chest Port 1 View  Result Date: 04/20/2018 CLINICAL DATA:  Chest pain EXAM: PORTABLE CHEST 1 VIEW COMPARISON:  04/12/2018 FINDINGS: Cardiac shadows within normal limits. Postsurgical changes are noted. The lungs are well aerated bilaterally. No focal infiltrate is seen. No bony abnormality is noted. IMPRESSION: No active disease. Electronically Signed   By: Alcide Clever M.D.   On: 04/20/2018 12:18     Procedures Procedures (including critical care time)  Medications Ordered in ED Medications  HYDROmorphone (DILAUDID) injection 1 mg (has no administration in time range)  ondansetron (ZOFRAN) injection 4 mg (has no administration in time range)     Initial Impression / Assessment and Plan / ED Course  I have reviewed the triage vital signs and the nursing notes.  Pertinent labs & imaging results that were available during my care of the patient were reviewed by me and considered in my medical decision making (see chart for details).  Iv ns. Continuous pulse ox and monitor. Ecg. Cxr.   Reviewed nursing notes and prior charts for additional history.   Pt already took asa. No change w ntg pta. Will give dilaudid 1 mg iv for pain.  Recheck pain improved. Pt given po fluids/snack.   Labs reviewed - initial trop normal. ddimer normal. Discussed plan with pt for repeat troponin in 3 hours.   cxr reviewed - no pna.  Went to recheck pt - pt had left ED/signed out AMA prior to completion of ED evaluation.     Final Clinical Impressions(s) / ED Diagnoses   Final diagnoses:  None    ED Discharge Orders    None           Cathren Laine, MD 04/21/18 [phone]

## 2018-04-20 NOTE — ED Triage Notes (Addendum)
Pt here c/o chest pain that woke him up from his sleep. Diagnosed with pneumonia 2 weeks ago. States hx of CABG and two catheterizations. Appears to be short of breath at this time. Clutching left side of chest. Reports jaw pain and radiation to left arm. Took 81mg  aspirin and two nitroglycerin doses at 1100 with no chest pain relief.

## 2018-11-21 ENCOUNTER — Inpatient Hospital Stay (HOSPITAL_COMMUNITY)
Admission: EM | Admit: 2018-11-21 | Discharge: 2018-11-23 | DRG: 247 | Discharge status: Against Medical Advice | Payer: BC Managed Care – PPO | Attending: Internal Medicine | Admitting: Internal Medicine

## 2018-11-21 ENCOUNTER — Encounter (HOSPITAL_COMMUNITY): Payer: Self-pay | Admitting: Emergency Medicine

## 2018-11-21 ENCOUNTER — Other Ambulatory Visit: Payer: Self-pay

## 2018-11-21 ENCOUNTER — Emergency Department (HOSPITAL_COMMUNITY): Payer: BC Managed Care – PPO

## 2018-11-21 DIAGNOSIS — Z87891 Personal history of nicotine dependence: Secondary | ICD-10-CM

## 2018-11-21 DIAGNOSIS — I200 Unstable angina: Secondary | ICD-10-CM | POA: Diagnosis present

## 2018-11-21 DIAGNOSIS — Z1159 Encounter for screening for other viral diseases: Secondary | ICD-10-CM

## 2018-11-21 DIAGNOSIS — I2510 Atherosclerotic heart disease of native coronary artery without angina pectoris: Secondary | ICD-10-CM | POA: Diagnosis present

## 2018-11-21 DIAGNOSIS — E1165 Type 2 diabetes mellitus with hyperglycemia: Secondary | ICD-10-CM | POA: Diagnosis present

## 2018-11-21 DIAGNOSIS — IMO0002 Reserved for concepts with insufficient information to code with codable children: Secondary | ICD-10-CM | POA: Diagnosis present

## 2018-11-21 DIAGNOSIS — I252 Old myocardial infarction: Secondary | ICD-10-CM | POA: Diagnosis not present

## 2018-11-21 DIAGNOSIS — Z7984 Long term (current) use of oral hypoglycemic drugs: Secondary | ICD-10-CM

## 2018-11-21 DIAGNOSIS — E1169 Type 2 diabetes mellitus with other specified complication: Secondary | ICD-10-CM

## 2018-11-21 DIAGNOSIS — I25110 Atherosclerotic heart disease of native coronary artery with unstable angina pectoris: Secondary | ICD-10-CM | POA: Diagnosis present

## 2018-11-21 DIAGNOSIS — Z6831 Body mass index (BMI) 31.0-31.9, adult: Secondary | ICD-10-CM | POA: Diagnosis not present

## 2018-11-21 DIAGNOSIS — E785 Hyperlipidemia, unspecified: Secondary | ICD-10-CM | POA: Diagnosis present

## 2018-11-21 DIAGNOSIS — R739 Hyperglycemia, unspecified: Secondary | ICD-10-CM

## 2018-11-21 DIAGNOSIS — I214 Non-ST elevation (NSTEMI) myocardial infarction: Secondary | ICD-10-CM | POA: Diagnosis present

## 2018-11-21 DIAGNOSIS — Z79891 Long term (current) use of opiate analgesic: Secondary | ICD-10-CM | POA: Diagnosis not present

## 2018-11-21 DIAGNOSIS — G8929 Other chronic pain: Secondary | ICD-10-CM | POA: Diagnosis present

## 2018-11-21 DIAGNOSIS — Z951 Presence of aortocoronary bypass graft: Secondary | ICD-10-CM

## 2018-11-21 DIAGNOSIS — Z7982 Long term (current) use of aspirin: Secondary | ICD-10-CM

## 2018-11-21 DIAGNOSIS — E669 Obesity, unspecified: Secondary | ICD-10-CM | POA: Diagnosis present

## 2018-11-21 DIAGNOSIS — Z720 Tobacco use: Secondary | ICD-10-CM | POA: Diagnosis present

## 2018-11-21 DIAGNOSIS — Z955 Presence of coronary angioplasty implant and graft: Secondary | ICD-10-CM

## 2018-11-21 LAB — URINALYSIS, ROUTINE W REFLEX MICROSCOPIC
Bacteria, UA: NONE SEEN
Bilirubin Urine: NEGATIVE
Glucose, UA: >=500 mg/dL — AB
Hgb urine dipstick: NEGATIVE
Ketones, ur: NEGATIVE mg/dL
Leukocytes,Ua: NEGATIVE
Nitrite: NEGATIVE
Protein, ur: NEGATIVE mg/dL
Specific Gravity, Urine: 1.029 (ref 1.005–1.030)
pH: 5 (ref 5.0–8.0)

## 2018-11-21 LAB — HEPARIN LEVEL (UNFRACTIONATED): Heparin Unfractionated: 0.42 IU/mL (ref 0.30–0.70)

## 2018-11-21 LAB — BASIC METABOLIC PANEL
Anion gap: 14 (ref 5–15)
BUN: 17 mg/dL (ref 6–20)
CO2: 23 mmol/L (ref 22–32)
Calcium: 9.6 mg/dL (ref 8.9–10.3)
Chloride: 99 mmol/L (ref 98–111)
Creatinine, Ser: 1.18 mg/dL (ref 0.61–1.24)
GFR calc Af Amer: >60 mL/min (ref >60)
GFR calc non Af Amer: >60 mL/min (ref >60)
Glucose, Bld: 508 mg/dL (ref 70–99)
Potassium: 4 mmol/L (ref 3.5–5.1)
Sodium: 136 mmol/L (ref 135–145)

## 2018-11-21 LAB — GLUCOSE, CAPILLARY
Glucose-Capillary: 266 mg/dL — ABNORMAL HIGH (ref 70–99)
Glucose-Capillary: 199 mg/dL — ABNORMAL HIGH (ref 70–99)

## 2018-11-21 LAB — TROPONIN I
Troponin I: <0.03 ng/mL (ref <0.03)
Troponin I: 0.14 ng/mL (ref <0.03)

## 2018-11-21 LAB — CBC
HCT: 55.2 % — ABNORMAL HIGH (ref 39.0–52.0)
Hemoglobin: 19.2 g/dL — ABNORMAL HIGH (ref 13.0–17.0)
MCH: 32.6 pg (ref 26.0–34.0)
MCHC: 34.8 g/dL (ref 30.0–36.0)
MCV: 93.7 fL (ref 80.0–100.0)
Platelets: 235 10*3/uL (ref 150–400)
RBC: 5.89 MIL/uL — ABNORMAL HIGH (ref 4.22–5.81)
RDW: 13 % (ref 11.5–15.5)
WBC: 17.6 10*3/uL — ABNORMAL HIGH (ref 4.0–10.5)
nRBC: 0 % (ref 0.0–0.2)

## 2018-11-21 LAB — HEMOGLOBIN A1C
Hgb A1c MFr Bld: 10.8 % — ABNORMAL HIGH (ref 4.8–5.6)
Mean Plasma Glucose: 263.26 mg/dL

## 2018-11-21 LAB — SARS CORONAVIRUS 2 BY RT PCR (HOSPITAL ORDER, PERFORMED IN ~~LOC~~ HOSPITAL LAB): SARS Coronavirus 2: NEGATIVE

## 2018-11-21 MED ORDER — INSULIN ASPART 100 UNIT/ML ~~LOC~~ SOLN
0.0000 [IU] | Freq: Three times a day (TID) | SUBCUTANEOUS | Status: DC
  Administered 2018-11-21: 8 [IU] via SUBCUTANEOUS
  Administered 2018-11-22: 5 [IU] via SUBCUTANEOUS
  Administered 2018-11-22: 3 [IU] via SUBCUTANEOUS
  Administered 2018-11-22 – 2018-11-23 (×2): 8 [IU] via SUBCUTANEOUS

## 2018-11-21 MED ORDER — DM-GUAIFENESIN ER 30-600 MG PO TB12: 1.0000 | ORAL_TABLET | Freq: Two times a day (BID) | ORAL | Status: DC | PRN

## 2018-11-21 MED ORDER — LURASIDONE HCL 20 MG PO TABS
60.0000 mg | ORAL_TABLET | Freq: Every day | ORAL | Status: DC
  Administered 2018-11-21 – 2018-11-22 (×2): 60 mg via ORAL
  Filled 2018-11-21 (×3): qty 3

## 2018-11-21 MED ORDER — ACETAMINOPHEN 325 MG PO TABS: 650.0000 mg | ORAL_TABLET | Freq: Four times a day (QID) | ORAL | Status: DC | PRN

## 2018-11-21 MED ORDER — NITROGLYCERIN IN D5W 200-5 MCG/ML-% IV SOLN
0.0000 ug/min | INTRAVENOUS | Status: DC
  Administered 2018-11-21: 5 ug/min via INTRAVENOUS
  Administered 2018-11-22: 17:00:00 10 ug/min via INTRAVENOUS
  Filled 2018-11-21: qty 250

## 2018-11-21 MED ORDER — OXYCODONE-ACETAMINOPHEN 7.5-325 MG PO TABS: 1.0000 | ORAL_TABLET | Freq: Four times a day (QID) | ORAL | Status: DC | PRN

## 2018-11-21 MED ORDER — HYDROMORPHONE HCL 1 MG/ML IJ SOLN
1.0000 mg | Freq: Once | INTRAMUSCULAR | Status: AC
  Administered 2018-11-21: 1 mg via INTRAVENOUS
  Filled 2018-11-21: qty 1

## 2018-11-21 MED ORDER — ADULT MULTIVITAMIN W/MINERALS CH
1.0000 | ORAL_TABLET | Freq: Every day | ORAL | Status: DC
  Administered 2018-11-21 – 2018-11-23 (×3): 1 via ORAL
  Filled 2018-11-21 (×3): qty 1

## 2018-11-21 MED ORDER — OXYCODONE HCL ER 20 MG PO T12A
20.0000 mg | EXTENDED_RELEASE_TABLET | Freq: Two times a day (BID) | ORAL | Status: DC
  Administered 2018-11-21 – 2018-11-23 (×4): 20 mg via ORAL
  Filled 2018-11-21 (×4): qty 1

## 2018-11-21 MED ORDER — DIPHENHYDRAMINE HCL 50 MG/ML IJ SOLN
12.5000 mg | Freq: Once | INTRAMUSCULAR | Status: AC
  Administered 2018-11-21: 12.5 mg via INTRAVENOUS
  Filled 2018-11-21: qty 1

## 2018-11-21 MED ORDER — LAMOTRIGINE 100 MG PO TABS
400.0000 mg | ORAL_TABLET | Freq: Every day | ORAL | Status: DC
  Administered 2018-11-21 – 2018-11-22 (×2): 400 mg via ORAL
  Filled 2018-11-21 (×3): qty 4

## 2018-11-21 MED ORDER — CLOPIDOGREL BISULFATE 75 MG PO TABS
75.0000 mg | ORAL_TABLET | Freq: Every day | ORAL | Status: DC
  Administered 2018-11-21 – 2018-11-23 (×3): 75 mg via ORAL
  Filled 2018-11-21 (×3): qty 1

## 2018-11-21 MED ORDER — ACETAMINOPHEN 650 MG RE SUPP: 650.0000 mg | Freq: Four times a day (QID) | RECTAL | Status: DC | PRN

## 2018-11-21 MED ORDER — ONDANSETRON HCL 4 MG PO TABS: 4.0000 mg | ORAL_TABLET | Freq: Four times a day (QID) | ORAL | Status: DC | PRN

## 2018-11-21 MED ORDER — ASPIRIN EC 325 MG PO TBEC
325.0000 mg | DELAYED_RELEASE_TABLET | Freq: Every day | ORAL | Status: DC
  Administered 2018-11-22: 325 mg via ORAL
  Filled 2018-11-21 (×2): qty 1

## 2018-11-21 MED ORDER — INSULIN GLARGINE 100 UNIT/ML ~~LOC~~ SOLN
20.0000 [IU] | Freq: Every day | SUBCUTANEOUS | Status: DC
  Administered 2018-11-21 – 2018-11-23 (×3): 20 [IU] via SUBCUTANEOUS
  Filled 2018-11-21 (×3): qty 0.2

## 2018-11-21 MED ORDER — ATORVASTATIN CALCIUM 40 MG PO TABS
40.0000 mg | ORAL_TABLET | Freq: Every day | ORAL | Status: DC
  Administered 2018-11-21 – 2018-11-22 (×2): 40 mg via ORAL
  Filled 2018-11-21 (×2): qty 1

## 2018-11-21 MED ORDER — NITROGLYCERIN 0.4 MG SL SUBL
SUBLINGUAL_TABLET | SUBLINGUAL | Status: AC
  Administered 2018-11-21: 0.4 mg
  Filled 2018-11-21: qty 3

## 2018-11-21 MED ORDER — SODIUM CHLORIDE 0.9 % IV BOLUS
500.0000 mL | Freq: Once | INTRAVENOUS | Status: AC
  Administered 2018-11-21: 500 mL via INTRAVENOUS

## 2018-11-21 MED ORDER — HEPARIN (PORCINE) 25000 UT/250ML-% IV SOLN
1300.0000 [IU]/h | INTRAVENOUS | Status: DC
  Administered 2018-11-21 – 2018-11-22 (×3): 1300 [IU]/h via INTRAVENOUS
  Filled 2018-11-21 (×3): qty 250

## 2018-11-21 MED ORDER — ONDANSETRON HCL 4 MG/2ML IJ SOLN: 4.0000 mg | Freq: Four times a day (QID) | INTRAMUSCULAR | Status: DC | PRN

## 2018-11-21 MED ORDER — OXYCODONE HCL ER 20 MG PO T12A: 20.0000 mg | EXTENDED_RELEASE_TABLET | Freq: Two times a day (BID) | ORAL | Status: DC

## 2018-11-21 MED ORDER — ONDANSETRON HCL 4 MG/2ML IJ SOLN
4.0000 mg | Freq: Once | INTRAMUSCULAR | Status: DC
  Filled 2018-11-21: qty 2

## 2018-11-21 MED ORDER — LISINOPRIL 5 MG PO TABS
5.0000 mg | ORAL_TABLET | Freq: Every day | ORAL | Status: DC
  Administered 2018-11-21 – 2018-11-23 (×3): 5 mg via ORAL
  Filled 2018-11-21 (×3): qty 1

## 2018-11-21 MED ORDER — OXYCODONE HCL 5 MG PO TABS
2.5000 mg | ORAL_TABLET | Freq: Four times a day (QID) | ORAL | Status: DC | PRN
  Administered 2018-11-21 – 2018-11-22 (×2): 2.5 mg via ORAL
  Filled 2018-11-21 (×2): qty 1

## 2018-11-21 MED ORDER — OXYCODONE-ACETAMINOPHEN 5-325 MG PO TABS
1.0000 | ORAL_TABLET | Freq: Four times a day (QID) | ORAL | Status: DC | PRN
  Administered 2018-11-21 – 2018-11-22 (×3): 1 via ORAL
  Filled 2018-11-21 (×3): qty 1

## 2018-11-21 MED ORDER — ALBUTEROL SULFATE HFA 108 (90 BASE) MCG/ACT IN AERS
2.0000 | INHALATION_SPRAY | Freq: Four times a day (QID) | RESPIRATORY_TRACT | Status: DC | PRN
  Filled 2018-11-21: qty 6.7

## 2018-11-21 MED ORDER — HEPARIN BOLUS VIA INFUSION
4000.0000 [IU] | Freq: Once | INTRAVENOUS | Status: AC
  Administered 2018-11-21: 4000 [IU] via INTRAVENOUS
  Filled 2018-11-21: qty 4000

## 2018-11-21 MED ORDER — BENZONATATE 100 MG PO CAPS: 200.0000 mg | ORAL_CAPSULE | Freq: Three times a day (TID) | ORAL | Status: DC | PRN

## 2018-11-21 MED ORDER — PROCHLORPERAZINE EDISYLATE 10 MG/2ML IJ SOLN
10.0000 mg | Freq: Once | INTRAMUSCULAR | Status: AC
  Administered 2018-11-21: 10 mg via INTRAVENOUS
  Filled 2018-11-21: qty 2

## 2018-11-21 MED ORDER — HYDROMORPHONE HCL 1 MG/ML IJ SOLN
0.5000 mg | Freq: Once | INTRAMUSCULAR | Status: DC
  Filled 2018-11-21: qty 1

## 2018-11-21 MED ORDER — ALBUTEROL SULFATE (2.5 MG/3ML) 0.083% IN NEBU: 2.5000 mg | INHALATION_SOLUTION | Freq: Four times a day (QID) | RESPIRATORY_TRACT | Status: DC | PRN

## 2018-11-21 MED ORDER — METOPROLOL TARTRATE 5 MG/5ML IV SOLN
5.0000 mg | Freq: Once | INTRAVENOUS | Status: AC
  Administered 2018-11-21: 5 mg via INTRAVENOUS
  Filled 2018-11-21: qty 5

## 2018-11-21 MED ORDER — KETOROLAC TROMETHAMINE 30 MG/ML IJ SOLN
30.0000 mg | Freq: Once | INTRAMUSCULAR | Status: AC
  Administered 2018-11-21: 30 mg via INTRAVENOUS
  Filled 2018-11-21: qty 1

## 2018-11-21 MED ORDER — METOPROLOL TARTRATE 25 MG PO TABS
25.0000 mg | ORAL_TABLET | Freq: Two times a day (BID) | ORAL | Status: DC
  Administered 2018-11-21 – 2018-11-23 (×5): 25 mg via ORAL
  Filled 2018-11-21 (×5): qty 1

## 2018-11-21 NOTE — ED Notes (Addendum)
Date and time results received: 11/21/18 12:28 PM  (use smartphrase ".now" to insert current time)  Test: glucose Critical Value: 508  Name of Provider Notified: [facility] RN and Dr Theora Gianotti  Orders Received? Or Actions Taken?:

## 2018-11-21 NOTE — ED Notes (Signed)
ED TO INPATIENT HANDOFF REPORT  ED Nurse Name and Phone #: Kelle DartingMandi L.  S Name/Age/Gender Kenneth Jones 48 y.o. male Room/Bed: WA19/WA19  Code Status   Code Status: Not on file  Home/SNF/Other Home Patient oriented to: self, place, person, time Is this baseline? Yes   Triage Complete: Triage complete  Chief Complaint [facility] / [facility]  Triage Note No notes on file   Allergies Allergies  Allergen Reactions  . Morphine And Related Shortness Of Breath and Itching    Level of Care/Admitting Diagnosis ED Disposition    ED Disposition Condition Comment   Admit  [facility] [[id number]]  Level of Care: Telemetry [5]  Admit to tele based on following criteria: Monitor for Ischemic changes  Covid Evaluation: Screening Protocol (No Symptoms)  Diagnosis: Unstable angina [facility](HCC) [[id number]]) [[id number]]  Admitting Physician: Zannie CoveJOSEPH, PREETHA [[id number]]  Attending Physician: Zannie CoveJOSEPH, PREETHA [[id number]]  Estimated length of stay: past midnight tomorrow  Certification:: I certify this patient will need inpatient services for at least 2 midnights  PT Class (Do Not Modify): Inpatient [101]  PT Acc Code (Do Not Modify): Private [1]       B Medical/Surgery History Past Medical History:  Diagnosis Date  . Diabetes mellitus without complication (HCC)   . MI (myocardial infarction) (HCC)   . Seizures (HCC)    Past Surgical History:  Procedure Laterality Date  . CARDIAC SURGERY       A IV Location/Drains/Wounds Patient Lines/Drains/Airways Status   Active Line/Drains/Airways    Name:   Placement date:   Placement time:   Site:   Days:   Peripheral IV 11/21/18 Left Forearm   11/21/18    1135    Forearm   less than 1   Peripheral IV 11/21/18 Left Wrist   11/21/18    1149    Wrist   less than 1          Intake/Output Last 24 hours No intake or output data in the 24 hours ending 11/21/18 1356  Labs/Imaging Results for orders placed or performed during the hospital  encounter of 11/21/18 (from the past 48 hour(s))  Basic metabolic panel     Status: Abnormal   Collection Time: 11/21/18 11:18 AM  Result Value Ref Range   Sodium 136 135 - 145 mmol/L   Potassium 4.0 3.5 - 5.1 mmol/L   Chloride 99 98 - 111 mmol/L   CO2 23 22 - 32 mmol/L   Glucose, Bld 508 (HH) 70 - 99 mg/dL    Comment: CRITICAL RESULT CALLED TO, READ BACK BY AND VERIFIED WITH: K.HALL,RN [phone] @1228  BY V.WILKINS    BUN 17 6 - 20 mg/dL   Creatinine, Ser 4.011.18 0.61 - 1.24 mg/dL   Calcium 9.6 8.9 - 02.710.3 mg/dL   GFR calc non Af Amer >60 >60 mL/min   GFR calc Af Amer >60 >60 mL/min   Anion gap 14 5 - 15    Comment: Performed at [facility], [address] [place], [place] [address]  CBC     Status: Abnormal   Collection Time: 11/21/18 11:18 AM  Result Value Ref Range   WBC 17.6 (H) 4.0 - 10.5 K/uL   RBC 5.89 (H) 4.22 - 5.81 MIL/uL   Hemoglobin 19.2 (H) 13.0 - 17.0 g/dL   HCT 44.055.2 (H) 34.739.0 - 42.552.0 %   MCV 93.7 80.0 - 100.0 fL   MCH 32.6 26.0 - 34.0 pg   MCHC 34.8 30.0 -  36.0 g/dL   RDW 16.113.0 09.611.5 - 04.515.5 %   Platelets 235 150 - 400 K/uL   nRBC 0.0 0.0 - 0.2 %    Comment: Performed at [facility], [address] [place], [place] [address]  Troponin I - ONCE - STAT     Status: None   Collection Time: 11/21/18 11:18 AM  Result Value Ref Range   Troponin I <0.03 <0.03 ng/mL    Comment: Performed at [facility], [address] [place], [place] [address]  SARS Coronavirus 2 (CEPHEID - Performed in [facility] hospital lab), Hosp Order     Status: None   Collection Time: 11/21/18 11:52 AM   Specimen: Nasopharyngeal Swab  Result Value Ref Range   SARS Coronavirus 2 NEGATIVE NEGATIVE    Comment: (NOTE) If result is NEGATIVE SARS-CoV-2 target nucleic acids are NOT DETECTED. The SARS-CoV-2 RNA is generally detectable in upper and lower  respiratory specimens during the acute phase of infection. The lowest  concentration of  SARS-CoV-2 viral copies this assay can detect is 250  copies / mL. A negative result does not preclude SARS-CoV-2 infection  and should not be used as the sole basis for treatment or other  patient management decisions.  A negative result may occur with  improper specimen collection / handling, submission of specimen other  than nasopharyngeal swab, presence of viral mutation(s) within the  areas targeted by this assay, and inadequate number of viral copies  (<250 copies / mL). A negative result must be combined with clinical  observations, patient history, and epidemiological information. If result is POSITIVE SARS-CoV-2 target nucleic acids are DETECTED. The SARS-CoV-2 RNA is generally detectable in upper and lower  respiratory specimens dur ing the acute phase of infection.  Positive  results are indicative of active infection with SARS-CoV-2.  Clinical  correlation with patient history and other diagnostic information is  necessary to determine patient infection status.  Positive results do  not rule out bacterial infection or co-infection with other viruses. If result is PRESUMPTIVE POSTIVE SARS-CoV-2 nucleic acids MAY BE PRESENT.   A presumptive positive result was obtained on the submitted specimen  and confirmed on repeat testing.  While 2019 novel coronavirus  (SARS-CoV-2) nucleic acids may be present in the submitted sample  additional confirmatory testing may be necessary for epidemiological  and / or clinical management purposes  to differentiate between  SARS-CoV-2 and other Sarbecovirus currently known to infect humans.  If clinically indicated additional testing with an alternate test  methodology [phone] is advised. The SARS-CoV-2 RNA is generally  detectable in upper and lower respiratory sp ecimens during the acute  phase of infection. The expected result is Negative. Fact Sheet for Patients:  BoilerBrush.com.cyhttps://www.fda.gov/media/136312/download Fact Sheet for Healthcare  Providers: https://pope.com/https://www.fda.gov/media/136313/download This test is not yet approved or cleared by the [place] FDA and has been authorized for detection and/or diagnosis of SARS-CoV-2 by FDA under an Emergency Use Authorization (EUA).  This EUA will remain in effect (meaning this test can be used) for the duration of the COVID-19 declaration under Section 564(b)(1) of the Act, 21 U.S.C. section 360bbb-3(b)(1), unless the authorization is terminated or revoked sooner. Performed at [facility], [address] [place], [place] [address]   Urinalysis, Routine w reflex microscopic     Status: Abnormal   Collection Time: 11/21/18 12:12 PM  Result Value Ref Range   Color, Urine YELLOW YELLOW   APPearance CLEAR CLEAR   Specific  Gravity, Urine 1.029 1.005 - 1.030   pH 5.0 5.0 - 8.0   Glucose, UA >=500 (A) NEGATIVE mg/dL   Hgb urine dipstick NEGATIVE NEGATIVE   Bilirubin Urine NEGATIVE NEGATIVE   Ketones, ur NEGATIVE NEGATIVE mg/dL   Protein, ur NEGATIVE NEGATIVE mg/dL   Nitrite NEGATIVE NEGATIVE   Leukocytes,Ua NEGATIVE NEGATIVE   RBC / HPF 0-5 0 - 5 RBC/hpf   Bacteria, UA NONE SEEN NONE SEEN    Comment: Performed at [facility], [place] [address], [place], [place] [address]   Dg Chest Port 1 View  Result Date: 11/21/2018 CLINICAL DATA:  Pt c/o chest pains, SOB, sweating, nausea, and jaw pain after doing yard work about 30 minutes ago. Pt hx of CABG and cardiac stents. EXAM: PORTABLE CHEST 1 VIEW COMPARISON:  04/20/2018 FINDINGS: Prior CABG. A focal lordotic projection. The lungs appear clear. No cardiomegaly observed. The left costophrenic angle is clipped. Chronic mild widening of the right AC joint. IMPRESSION: 1. No acute findings. 2. Prior CABG. Electronically Signed   By: Van Clines M.D.   On: 11/21/2018 12:35    Pending Labs Unresulted Labs (From admission, onward)    Start     Ordered   11/22/18 0500  CBC  Daily,   R     11/21/18  1152   11/21/18 1830  Heparin level (unfractionated)  Once-Timed,   STAT     11/21/18 1152   11/21/18 1333  Troponin I - Now Then Q6H  Now then every 6 hours,   R (with STAT occurrences)     11/21/18 1332   Signed and Held  HIV antibody (Routine Testing)  Once,   R     Signed and Held   Signed and Held  Hemoglobin A1c  Add-on,   R     Signed and Held   Signed and Held  CBC  Tomorrow morning,   R     Signed and Held   Signed and Held  Basic metabolic panel  Tomorrow morning,   R     Signed and Held          Vitals/Pain Today's Vitals   11/21/18 1315 11/21/18 1330 11/21/18 1345 11/21/18 1350  BP:      Pulse: 84 84 82 81  Resp: 14 18 17 19   Temp:      TempSrc:      SpO2: 98% 96% 97% 96%  Weight:      Height:      PainSc:        Isolation Precautions No active isolations  Medications Medications  nitroGLYCERIN 50 mg in dextrose 5 % 250 mL (0.2 mg/mL) infusion (10 mcg/min Intravenous Rate/Dose Change 11/21/18 1300)  heparin ADULT infusion 100 units/mL (25000 units/246mL sodium chloride 0.45%) (1,300 Units/hr Intravenous New Bag/Given 11/21/18 1313)  insulin aspart (novoLOG) injection 0-15 Units (has no administration in time range)  nitroGLYCERIN (NITROSTAT) 0.4 MG SL tablet (0.4 mg  Given 11/21/18 1128)  heparin bolus via infusion 4,000 Units (4,000 Units Intravenous Bolus from Bag 11/21/18 1240)  metoprolol tartrate (LOPRESSOR) injection 5 mg (5 mg Intravenous Given 11/21/18 1150)  sodium chloride 0.9 % bolus 500 mL (0 mLs Intravenous Stopped 11/21/18 1354)    Mobility walks Low fall risk   Focused Assessments Cardiac Assessment Handoff:  Cardiac Rhythm: Sinus tachycardia Lab Results  Component Value Date   TROPONINI <0.03 11/21/2018   Lab Results  Component Value Date   DDIMER 0.30 04/20/2018   Does the  Patient currently have chest pain? Yes     R Recommendations: See Admitting Provider Note  Report given to:   Additional Notes:

## 2018-11-21 NOTE — ED Notes (Signed)
Report called to floor

## 2018-11-21 NOTE — Progress Notes (Addendum)
[place] for IV heparin Indication: UA, r/o ACS  Allergies  Allergen Reactions  . Morphine And Related Shortness Of Breath and Itching    Patient Measurements: Height: 5\' 10"  (177.8 cm) Weight: 215 lb (97.5 kg) IBW/kg (Calculated) : 73 Heparin Dosing Weight: 93 kg  Vital Signs: Temp: 99.1 F (37.3 C) (06/13 1506) Temp Source: Oral (06/13 1506) BP: 108/79 (06/13 1506) Pulse Rate: 81 (06/13 1506)  Labs: Recent Labs    11/21/18 1118 11/21/18 1805  HGB 19.2*  --   HCT 55.2*  --   PLT 235  --   HEPARINUNFRC  --  0.42  CREATININE 1.18  --   TROPONINI <0.03  --     Estimated Creatinine Clearance: 90.6 mL/min (by C-G formula based on SCr of 1.18 mg/dL).   Medical History: Past Medical History:  Diagnosis Date  . Diabetes mellitus without complication ([place])   . MI (myocardial infarction) ([place])   . Seizures ([place])     Medications:  Medications Prior to Admission  Medication Sig Dispense Refill Last Dose  . albuterol (PROVENTIL HFA;VENTOLIN HFA) 108 (90 Base) MCG/ACT inhaler Inhale 2 puffs into the lungs every 6 (six) hours as needed for wheezing or shortness of breath. 1 Inhaler 0 Past Month at Unknown time  . aspirin EC 81 MG tablet Take 81 mg by mouth daily.   11/21/2018 at Unknown time  . clopidogrel (PLAVIX) 75 MG tablet Take 75 mg by mouth daily.   11/20/2018 at Unknown time  . ibuprofen (ADVIL,MOTRIN) 200 MG tablet Take 400 mg by mouth daily as needed for moderate pain.    11/20/2018 at Unknown time  . lamoTRIgine (LAMICTAL) 200 MG tablet Take 400 mg by mouth at bedtime.    11/20/2018 at 2100  . lisinopril (PRINIVIL,ZESTRIL) 5 MG tablet Take 5 mg by mouth daily.   11/20/2018 at Unknown time  . Lurasidone HCl (LATUDA) 60 MG TABS Take 1 tablet by mouth at bedtime.   11/20/2018 at Unknown time  . metFORMIN (GLUCOPHAGE) 1000 MG tablet Take 1,000 mg by mouth daily.   11/21/2018 at Unknown time  . metoprolol tartrate (LOPRESSOR) 25 MG  tablet Take 25 mg by mouth daily with supper.   11/20/2018 at 2100  . Multiple Vitamin (MULTIVITAMIN WITH MINERALS) TABS tablet Take 1 tablet by mouth daily.   11/20/2018 at Unknown time  . oxyCODONE (OXYCONTIN) 20 mg 12 hr tablet Take 20 mg by mouth every 12 (twelve) hours.   11/21/2018 at Unknown time  . oxyCODONE-acetaminophen (PERCOCET) 7.5-325 MG tablet Take 1 tablet by mouth every 6 (six) hours as needed for severe pain.    11/21/2018 at Unknown time  . simvastatin (ZOCOR) 80 MG tablet Take 80 mg by mouth at bedtime.   11/20/2018 at Unknown time  . acetaminophen (TYLENOL) 325 MG tablet Take 650 mg by mouth every 6 (six) hours as needed for moderate pain.    unknown  . acetic acid-hydrocortisone (VOSOL-HC) OTIC solution Place 3 drops into both ears 3 (three) times daily. For 5 days (Patient not taking: Reported on 04/12/2018) 10 mL 0 Completed Course at Unknown time  . albuterol (PROVENTIL) (5 MG/ML) 0.5% nebulizer solution Take 0.5 mLs (2.5 mg total) by nebulization every 6 (six) hours as needed for wheezing or shortness of breath. 20 mL 0 unknown  . benzonatate (TESSALON) 200 MG capsule Take 1 capsule (200 mg total) by mouth 3 (three) times daily as needed. (Patient taking differently: Take 200 mg by  mouth 3 (three) times daily as needed for cough. ) 20 capsule 0 unknown  . dextromethorphan-guaiFENesin (MUCINEX DM) 30-600 MG 12hr tablet Take 1 tablet by mouth 2 (two) times daily as needed for cough.   unknown  . diclofenac sodium (VOLTAREN) 1 % GEL Apply 2 g topically 4 (four) times daily as needed (pain).    unknown  . doxycycline (VIBRA-TABS) 100 MG tablet Take 1 tablet (100 mg total) by mouth 2 (two) times daily. (Patient not taking: Reported on 11/21/2018) 20 tablet 0 Completed Course at Unknown time  . pseudoephedrine (SUDAFED) 120 MG 12 hr tablet Take 120 mg by mouth daily as needed for congestion.   unknown   Scheduled:  . [START ON 11/22/2018] aspirin EC  325 mg Oral Daily  . atorvastatin  40  mg Oral q1800  . clopidogrel  75 mg Oral Daily  . insulin aspart  0-15 Units Subcutaneous TID WC  . insulin glargine  20 Units Subcutaneous Daily  . lamoTRIgine  400 mg Oral QHS  . lisinopril  5 mg Oral Daily  . lurasidone  60 mg Oral QHS  . metoprolol tartrate  25 mg Oral BID  . multivitamin with minerals  1 tablet Oral Daily  . oxyCODONE  20 mg Oral Q12H    Assessment: [age] yoM with extensive CAD s/p CABG in 2012, PCI 2015, seizures, DM, presenting with classic MI symptoms (CP radiating to left arm, jaw pain, diaphoresis, n/v). Difficulty getting good read on EKG d/t wandering baseline and tachycardia. Pharmacy to dose heparin for ACS.   Baseline INR, aPTT: not done  Prior anticoagulation: none, ASA/Plavix only  Significant events:  Today, 11/21/2018:  CBC: WNL  No bleeding or infusion issues per nursing  Goal of Therapy: Heparin level 0.3-0.7 units/ml Monitor platelets by anticoagulation protocol: Yes  Plan:  Heparin 4000 units IV bolus x 1  Heparin 1300 units/hr IV infusion  Check heparin level 6 hrs after start  Daily CBC, daily heparin level once stable  Monitor for signs of bleeding or thrombosis   Bernadene Personrew Hardie Veltre, PharmD, BCPS [phone] 11/21/2018, 7:04 PM    ADDENDUM  1st heparin level therapeutic on 1300 units/hr  No bleeding or line issues per RN  Plan:  Continue heparin at 1300 units/hr  Will defer confirmatory heparin level to tomorrow AM  Bernadene Personrew Marzell Allemand, PharmD, BCPS [phone] 11/21/2018, 7:07 PM

## 2018-11-21 NOTE — ED Notes (Signed)
Pt c/o chest pains, SOB, sweating, nausea, jaw pain after doing yard work about 30 minutes ago. Pt hx CABG, cardiac stents.

## 2018-11-21 NOTE — ED Notes (Signed)
0.5 Dilaudid, 4mg  Zofran administered before order was dicontinued

## 2018-11-21 NOTE — H&P (Addendum)
History and Physical    Alleen BorneFrancis E Strawder ZOX:[id number] DOB: 09/10/1970 DOA: 11/21/2018  Referring MD/NP/PA: EDP PCP:  Patient coming from: Home  Chief Complaint: chest pain  HPI: Alleen BorneFrancis E Kenneth Jones is a 48 y.o. male with medical history significant for CAD, type 2 diabetes mellitus, tobacco abuse relocated to [place] few months ago, reports having a CABG in 2012, subsequently in 2015 he had [facility] [place], patient reports feeling poorly this week with generalized malaise, weakness from baseline and an episode of dizziness. -This morning he was doing some yard work when he started experiencing sudden onset left-sided chest pain, he felt as though this was pulsating, it radiated down his left arm, to his jaw, was associated with nausea and one episode of vomiting. -Subsequently his wife called EMS and he was brought to the emergency room ED Course: Vital signs were stable, noted to have blood glucose of 508, EKG was abnormal with concern for ST elevation in inferior leads, initially a STEMI call was activated, Dr. Adline MangoKellyRecommended nitro drip, heparin bolus and IV Lopressor try to slow down his heart rate to obtain a better EKG. -Subsequently EDP consulted cardiology Dr. Shirlee LatchMcLean who [facility] if troponin is abnormal  Review of Systems: As per HPI otherwise 14 point review of systems negative.   Past Medical History:  Diagnosis Date  . Diabetes mellitus without complication (H[facility])   . MI (myocardial infarction) ([facility])   . Seizures ([facility])     Past Surgical History:  Procedure Laterality Date  . CARDIAC SURGERY       reports that he has been smoking. He has never used smokeless tobacco. He reports previous alcohol use. He reports previous drug use.  Allergies  Allergen Reactions  . Morphine And Related Shortness Of Breath and Itching    No family history on file.   Prior to Admission medications   Medication Sig Start Date  End Date Taking? Authorizing Provider  albuterol (PROVENTIL HFA;VENTOLIN HFA) 108 (90 Base) MCG/ACT inhaler Inhale 2 puffs into the lungs every 6 (six) hours as needed for wheezing or shortness of breath. 04/08/18  Yes Zachery DauerGraham, Elysa, NP  aspirin EC 81 MG tablet Take 81 mg by mouth daily.   Yes [provider]  clopidogrel (PLAVIX) 75 MG tablet Take 75 mg by mouth daily.   Yes [provider]  ibuprofen (ADVIL,MOTRIN) 200 MG tablet Take 400 mg by mouth daily as needed for moderate pain.    Yes [provider]  lamoTRIgine (LAMICTAL) 200 MG tablet Take 400 mg by mouth at bedtime.    Yes [provider]  lisinopril (PRINIVIL,ZESTRIL) 5 MG tablet Take 5 mg by mouth daily.   Yes [provider]  Lurasidone HCl (LATUDA) 60 MG TABS Take 1 tablet by mouth at bedtime.   Yes [provider]  metFORMIN (GLUCOPHAGE) 1000 MG tablet Take 1,000 mg by mouth daily.   Yes [provider]  metoprolol tartrate (LOPRESSOR) 25 MG tablet Take 25 mg by mouth daily with supper.   Yes [provider]  Multiple Vitamin (MULTIVITAMIN WITH MINERALS) TABS tablet Take 1 tablet by mouth daily.   Yes [provider]  oxyCODONE (OXYCONTIN) 20 mg 12 hr tablet Take 20 mg by mouth every 12 (twelve) hours.   Yes [provider]  oxyCODONE-acetaminophen (PERCOCET) 7.5-325 MG tablet Take 1 tablet by mouth every 6 (six) hours as needed for severe pain.    Yes [provider]  simvastatin (ZOCOR) 80 MG tablet Take 80 mg by mouth at bedtime.   Yes [provider]  acetaminophen (TYLENOL) 325 MG tablet Take 650 mg by mouth every 6 (six) hours as needed for moderate pain.     [provider]  acetic acid-hydrocortisone (VOSOL-HC) OTIC solution Place 3 drops into both ears 3 (three) times daily. For 5 days Patient not taking: Reported on 04/12/2018 11/24/17   Zachery DauerGraham, Elysa, NP  albuterol (PROVENTIL) (5 MG/ML) 0.5% nebulizer  solution Take 0.5 mLs (2.5 mg total) by nebulization every 6 (six) hours as needed for wheezing or shortness of breath. 04/12/18   Tilden Fossaees, Elizabeth, MD  benzonatate (TESSALON) 200 MG capsule Take 1 capsule (200 mg total) by mouth 3 (three) times daily as needed. Patient taking differently: Take 200 mg by mouth 3 (three) times daily as needed for cough.  04/08/18   Zachery DauerGraham, Elysa, NP  dextromethorphan-guaiFENesin [facility](MUCINEX DM) 30-600 MG 12hr tablet Take 1 tablet by mouth 2 (two) times daily as needed for cough.    [provider]  diclofenac sodium (VOLTAREN) 1 % GEL Apply 2 g topically 4 (four) times daily as needed (pain).     [provider]  doxycycline (VIBRA-TABS) 100 MG tablet Take 1 tablet (100 mg total) by mouth 2 (two) times daily. Patient not taking: Reported on 11/21/2018 04/08/18   Zachery DauerGraham, Elysa, NP  pseudoephedrine (SUDAFED) 120 MG 12 hr tablet Take 120 mg by mouth daily as needed for congestion.    [provider]    Physical Exam: Vitals:   11/21/18 1257 11/21/18 1300 11/21/18 1312 11/21/18 1315  BP: 120/81 124/78 117/63   Pulse: 93 85 82 84  Resp: 18 12 17 14   Temp:   98.9 F (37.2 C)   TempSrc:   Oral   SpO2: 97% 97% 97% 98%  Weight:      Height:          Constitutional: Obese male, laying in bed, slightly uncomfortable appearing, AAO x3 Vitals:   11/21/18 1257 11/21/18 1300 11/21/18 1312 11/21/18 1315  BP: 120/81 124/78 117/63   Pulse: 93 85 82 84  Resp: 18 12 17 14   Temp:   98.9 F (37.2 C)   TempSrc:   Oral   SpO2: 97% 97% 97% 98%  Weight:      Height:       Eyes: PERRL, lids and conjunctivae normal ENMT: Mucous membranes are moist.  Neck: Neck obese, unable to assess JVD Respiratory: Poor air movement bilaterally, otherwise clear Cardiovascular: S1-S2/regular rhythm, slightly tachycardic, sternotomy scar from prior CABG Abdomen: soft, non tender, Bowel sounds positive.  Musculoskeletal: No joint deformity upper and lower  extremities. Ext: No edema skin: no rashes, lesions, ulcers.  Neurologic: Moves all extremities, no localizing signs Psychiatric: Normal judgment and insight. Alert and oriented x 3. Normal mood.   Labs on Admission: I have personally reviewed following labs and imaging studies  CBC: Recent Labs  Lab 11/21/18 1118  WBC 17.6*  HGB 19.2*  HCT 55.2*  MCV 93.7  PLT 235   Basic Metabolic Panel: Recent Labs  Lab 11/21/18 1118  NA 136  K 4.0  CL 99  CO2 23  GLUCOSE 508*  BUN 17  CREATININE 1.18  CALCIUM 9.6   GFR: Estimated Creatinine Clearance: 90.6 mL/min (by C-G formula based on SCr of 1.18 mg/dL). Liver Function Tests: No results for input(s): AST, ALT, ALKPHOS, BILITOT, PROT, ALBUMIN in the last 168 hours. No results  for input(s): LIPASE, AMYLASE in the last 168 hours. No results for input(s): AMMONIA in the last 168 hours. Coagulation Profile: No results for input(s): INR, PROTIME in the last 168 hours. Cardiac Enzymes: Recent Labs  Lab 11/21/18 1118  TROPONINI <0.03   BNP (last 3 results) No results for input(s): PROBNP in the last 8760 hours. HbA1C: No results for input(s): HGBA1C in the last 72 hours. CBG: No results for input(s): GLUCAP in the last 168 hours. Lipid Profile: No results for input(s): CHOL, HDL, LDLCALC, TRIG, CHOLHDL, LDLDIRECT in the last 72 hours. Thyroid Function Tests: No results for input(s): TSH, T4TOTAL, FREET4, T3FREE, THYROIDAB in the last 72 hours. Anemia Panel: No results for input(s): VITAMINB12, FOLATE, FERRITIN, TIBC, IRON, RETICCTPCT in the last 72 hours. Urine analysis:    Component Value Date/Time   COLORURINE YELLOW 11/21/2018 1212   APPEARANCEUR CLEAR 11/21/2018 1212   LABSPEC 1.029 11/21/2018 1212   PHURINE 5.0 11/21/2018 1212   GLUCOSEU >=500 (A) 11/21/2018 1212   HGBUR NEGATIVE 11/21/2018 1212   BILIRUBINUR NEGATIVE 11/21/2018 1212   KETONESUR NEGATIVE 11/21/2018 1212   PROTEINUR NEGATIVE 11/21/2018 1212    NITRITE NEGATIVE 11/21/2018 1212   LEUKOCYTESUR NEGATIVE 11/21/2018 1212   Sepsis Labs: @LABRCNTIP (procalcitonin:4,lacticidven:4) ) Recent Results (from the past 240 hour(s))  SARS Coronavirus 2 (CEPHEID - Performed in [place] hospital lab), Hosp Order     Status: None   Collection Time: 11/21/18 11:52 AM   Specimen: Nasopharyngeal Swab  Result Value Ref Range Status   SARS Coronavirus 2 NEGATIVE NEGATIVE Final    Comment: (NOTE) If result is NEGATIVE SARS-CoV-2 target nucleic acids are NOT DETECTED. The SARS-CoV-2 RNA is generally detectable in upper and lower  respiratory specimens during the acute phase of infection. The lowest  concentration of SARS-CoV-2 viral copies this assay can detect is 250  copies / mL. A negative result does not preclude SARS-CoV-2 infection  and should not be used as the sole basis for treatment or other  patient management decisions.  A negative result may occur with  improper specimen collection / handling, submission of specimen other  than nasopharyngeal swab, presence of viral mutation(s) within the  areas targeted by this assay, and inadequate number of viral copies  (<250 copies / mL). A negative result must be combined with clinical  observations, patient history, and epidemiological information. If result is POSITIVE SARS-CoV-2 target nucleic acids are DETECTED. The SARS-CoV-2 RNA is generally detectable in upper and lower  respiratory specimens dur ing the acute phase of infection.  Positive  results are indicative of active infection with SARS-CoV-2.  Clinical  correlation with patient history and other diagnostic information is  necessary to determine patient infection status.  Positive results do  not rule out bacterial infection or co-infection with other viruses. If result is PRESUMPTIVE POSTIVE SARS-CoV-2 nucleic acids MAY BE PRESENT.   A presumptive positive result was obtained on the submitted specimen  and confirmed on repeat  testing.  While 2019 novel coronavirus  (SARS-CoV-2) nucleic acids may be present in the submitted sample  additional confirmatory testing may be necessary for epidemiological  and / or clinical management purposes  to differentiate between  SARS-CoV-2 and other Sarbecovirus currently known to infect humans.  If clinically indicated additional testing with an alternate test  methodology [phone]) is advised. The SARS-CoV-2 RNA is generally  detectable in upper and lower respiratory sp ecimens during the acute  phase of infection. The expected result is Negative. Fact Sheet for  Patients:  BoilerBrush.com.cyhttps://www.fda.gov/media/136312/download Fact Sheet for Healthcare Providers: https://pope.com/https://www.fda.gov/media/136313/download This test is not yet approved or cleared by the [place] FDA and has been authorized for detection and/or diagnosis of SARS-CoV-2 by FDA under an Emergency Use Authorization (EUA).  This EUA will remain in effect (meaning this test can be used) for the duration of the COVID-19 declaration under Section 564(b)(1) of the Act, 21 U.S.C. section 360bbb-3(b)(1), unless the authorization is terminated or revoked sooner. Performed at [facility], [address] [place], [place] [address]      Radiological Exams on Admission: Dg Chest Port 1 View  Result Date: 11/21/2018 CLINICAL DATA:  Pt c/o chest pains, SOB, sweating, nausea, and jaw pain after doing yard work about 30 minutes ago. Pt hx of CABG and cardiac stents. EXAM: PORTABLE CHEST 1 VIEW COMPARISON:  04/20/2018 FINDINGS: Prior CABG. A focal lordotic projection. The lungs appear clear. No cardiomegaly observed. The left costophrenic angle is clipped. Chronic mild widening of the right AC joint. IMPRESSION: 1. No acute findings. 2. Prior CABG. Electronically Signed   By: Gaylyn RongWalter  Liebkemann M.D.   On: 11/21/2018 12:35    EKG: Independently reviewed.  Sinus rhythm, initial EKGs were concerning for ST elevation  in inferior leads and minimally in lateral leads as well subsequent EKGs do show ST-T wave changes in inferior leads less pronounced from before  Assessment/Plan Active Problems:  Chest pain/unstable angina -EKG is abnormal with ST-T wave changes in inferior leads -will admit to telemetry  -Continue IV heparin and nitroglycerin drip -Aspirin, Plavix, statin -Consulted cardiology, discussed with Dr. Shirlee LatchMcLean, [facility], Cardiology will consult in a.m. , if clinical condition worsens or troponins trend up will [facility] -Covid 19 PCR is negative  Uncontrolled diabetes mellitus -CBG was 500, attributes this to a drink he had prior to arrival in the ED -Check hemoglobin A1c, add Lantus 20 units now and sliding scale -Hold metformin  Tobacco abuse -Counseled  History of CAD/CABG -CAB[facility] in 2012 at [facility] in [place] -Followed by PCI at [facility] in [place] in 2015 -We will request records -See discussion above   Chronic pain -Related to his sternotomy from prior CABG -Chronic and stable, continue home regimen of oxycodone -Per patient history is significantly different from current symptoms that brought him to the ER  Obesity -Needs lifestyle modification  DVT prophylaxis: IV heparin Code Status: Full Code  Family Communication: NO family at bedside  Disposition Plan: to be determined Consults called: Cardiology Dr.McLean  Admission status: inpatient   Zannie CovePreetha Massiah Minjares MD [facility]   11/21/2018, 1:39 PM

## 2018-11-21 NOTE — Progress Notes (Signed)
Paged by bedside RN regarding patients ongoing headache/chest pain. Pt originally admitted for chest pain/unstable angina. Pt with ongoing chest pain that radiates to his left arm. He rates it 5-7/10 and states it has not gone away since admission. Critical Troponin called back of 0.14. Unable to tolerate titrating Nitro drip past 53mcg/min due to sever headache. Ordered headache cocktail to hopefully be able to titrate nitro gtt up further. Spoke with Cardiology regarding pts condition and they recommended that he be transferred to [facility] and they will see him upon his arrival. Pt agreeable to this plan as well. Ordered 0.5 mg of Dilaudid IV to help with chest pain and will transfer to [facility] for further Cardiology evaluation tonight.  Arby Barrette AGPCNP-BC, AGNP-C [facility] Pager [phone]

## 2018-11-21 NOTE — Progress Notes (Signed)
Report called to RN receiving patient at [facility], unit 2C --  room 03C. Patient is alert and oriented laying in bed. C/O 7/10 chest pain, radiating down left arm. Nitro drip increased to 87mcg/hr. [facility] called for transport. Will continue to monitor.

## 2018-11-21 NOTE — ED Notes (Signed)
Admitting provider at bedside.

## 2018-11-21 NOTE — ED Provider Notes (Signed)
[facility]-EMERGENCY DEPT Provider Note   CSN: [id number] Arrival date & time: 11/21/18  1107     History   Chief Complaint Chief Complaint  Patient presents with  . Chest Pain  . Dizziness  . Shortness of Breath    HPI Kenneth Jones is a 48 y.o. male.     Patient is a 48 year old male with a history of seizures, diabetes and extensive coronary artery disease status post CABG in 2012, stenting in 2015 and repeat catheterization in 2018 not requiring intervention who presents today with complaints of chest pain.  Patient states all this week he has had some dizziness with standing and some mild occasional pain that has spontaneously resolved.  Today he was outside trimming bushes and doing some yard work when 30 minutes prior to arrival he developed severe tight pain in his chest that radiates into his back and midway down his left arm.  He became very sweaty and had an episode of vomiting at home.  He did take a 325 aspirin and and nitroglycerin at home which did not initially seem to help.  When he got here he was starting to feel better but then while sitting in the waiting room the pain returned.  He also reports having a syncopal event at home.  He otherwise has been feeling his normal self and denies cough, fever, abdominal pain or diarrhea.  No prior history of DVT or PE.  Patient's [place] all of [place] cardiac evaluation done in [place] and was trying to get a cardiologist in this area.  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  The history is provided by the patient.    Past Medical History:  Diagnosis Date  . Diabetes mellitus without complication (HCC)   . MI (myocardial infarction) (HCC)   . Seizures [facility](HCC)     Patient Active Problem List   Diagnosis Date Noted  . Hx of CABG  03/10/2011    Past Surgical History:  Procedure Laterality Date  . CARDIAC SURGERY          Home Medications    Prior to Admission medications   Medication Sig Start Date End Date Taking? Authorizing Provider  acetaminophen (TYLENOL) 325 MG tablet Take 650 mg by mouth every 6 (six) hours as needed for moderate pain.     [provider]  acetic acid-hydrocortisone (VOSOL-HC) OTIC solution Place 3 drops into both ears 3 (three) times daily. For 5 days Patient not taking: Reported on 04/12/2018 11/24/17   Zachery DauerGraham, Elysa, NP  albuterol (PROVENTIL HFA;VENTOLIN HFA) 108 (90 Base) MCG/ACT inhaler Inhale 2 puffs into the lungs every 6 (six) hours as needed for wheezing or shortness of breath. 04/08/18   Zachery DauerGraham, Elysa, NP  albuterol (PROVENTIL) (5 MG/ML) 0.5% nebulizer solution Take 0.5 mLs (2.5 mg total) by nebulization every 6 (six) hours as needed for wheezing or shortness of breath. 04/12/18   Tilden Fossaees, Elizabeth, MD  aspirin EC 81 MG tablet Take 81 mg by mouth daily.    [provider]  benzonatate (TESSALON) 200 MG capsule Take 1 capsule (200 mg total) by mouth 3 (three) times daily as needed. 04/08/18   Zachery DauerGraham, Elysa, NP  clopidogrel (PLAVIX) 75 MG tablet Take 75 mg by mouth daily.    [provider]  dextromethorphan-guaiFENesin [facility](MUCINEX  DM) 30-600 MG 12hr tablet Take 1 tablet by mouth 2 (two) times daily as needed for cough.    [provider]  diclofenac sodium (VOLTAREN) 1 % GEL Apply 2 g topically 4 (four) times daily as needed (pain).     [provider]  doxycycline (VIBRA-TABS) 100 MG tablet Take 1 tablet (100 mg total) by mouth 2 (two) times daily. 04/08/18   Shella Maxim, NP  ibuprofen (ADVIL,MOTRIN) 200 MG tablet Take 800 mg by mouth daily as needed for moderate pain.    [provider]  lamoTRIgine (LAMICTAL) 200 MG tablet Take 400 mg by mouth at bedtime.     [provider]  lisinopril (PRINIVIL,ZESTRIL) 5 MG tablet Take 5  mg by mouth daily.    [provider]  metFORMIN (GLUCOPHAGE) 1000 MG tablet Take 1,000 mg by mouth daily.    [provider]  metoprolol tartrate (LOPRESSOR) 25 MG tablet Take 25 mg by mouth daily with supper.    [provider]  Multiple Vitamin (MULTIVITAMIN WITH MINERALS) TABS tablet Take 1 tablet by mouth daily.    [provider]  oxyCODONE (OXYCONTIN) 20 mg 12 hr tablet Take 20 mg by mouth every 12 (twelve) hours.    [provider]  oxyCODONE-acetaminophen (PERCOCET) 7.5-325 MG tablet Take 1 tablet by mouth every 6 (six) hours as needed for severe pain.     [provider]  pseudoephedrine (SUDAFED) 120 MG 12 hr tablet Take 120 mg by mouth daily as needed for congestion.    [provider]  simvastatin (ZOCOR) 80 MG tablet Take 80 mg by mouth at bedtime.    [provider]  tamsulosin (FLOMAX) 0.4 MG CAPS capsule Take 0.4 mg by mouth daily.    [provider]    Family History No family history on file.  Social History Social History   Tobacco Use  . Smoking status: Current Every Day Smoker  . Smokeless tobacco: Never Used  Substance Use Topics  . Alcohol use: Not Currently  . Drug use: Not Currently     Allergies   Morphine and related   Review of Systems Review of Systems  Genitourinary:       Avril weeks of urinary frequency and urgency which she was planning on following up with her PCP next week for evaluation  All other systems reviewed and are negative.    Physical Exam Updated Vital Signs BP 108/77   Pulse (!) 119   Temp 99 F (37.2 C) (Oral)   Resp 18   Ht 5\' 10"  (1.778 m)   Wt 97.5 kg   SpO2 96%   BMI 30.85 kg/m   Physical Exam Vitals signs and nursing note reviewed.  Constitutional:      General: He is in acute distress.     Appearance: He is well-developed. He is obese. He is not diaphoretic.     Comments: Appears uncomfortable  HENT:     Head: Normocephalic  and atraumatic.  Eyes:     Conjunctiva/sclera: Conjunctivae normal.     Pupils: Pupils are equal, round, and reactive to light.  Neck:     Musculoskeletal: Normal range of motion and neck supple.  Cardiovascular:     Rate and Rhythm: Regular rhythm. Tachycardia present.     Heart sounds: No murmur.     Comments: Well healed sternotomy scar Pulmonary:     Effort: Pulmonary effort is normal. No respiratory distress.     Breath sounds: Normal  breath sounds. No wheezing or rales.  Abdominal:     General: There is no distension.     Palpations: Abdomen is soft.     Tenderness: There is no abdominal tenderness. There is no guarding or rebound.  Musculoskeletal: Normal range of motion.        General: No tenderness.  Skin:    General: Skin is warm and dry.     Capillary Refill: Capillary refill takes less than 2 seconds.     Findings: No erythema or rash.  Neurological:     General: No focal deficit present.     Mental Status: He is alert and oriented to person, place, and time. Mental status is at baseline.  Psychiatric:        Mood and Affect: Mood normal.        Behavior: Behavior normal.        Thought Content: Thought content normal.      ED Treatments / Results  Labs (all labs ordered are listed, but only abnormal results are displayed) Labs Reviewed  BASIC METABOLIC PANEL - Abnormal; Notable for the following components:      Result Value   Glucose, Bld 508 (*)    All other components within normal limits  CBC - Abnormal; Notable for the following components:   WBC 17.6 (*)    RBC 5.89 (*)    Hemoglobin 19.2 (*)    HCT 55.2 (*)    All other components within normal limits  URINALYSIS, ROUTINE W REFLEX MICROSCOPIC - Abnormal; Notable for the following components:   Glucose, UA >=500 (*)    All other components within normal limits  SARS CORONAVIRUS 2 (HOSPITAL ORDER, PERFORMED IN [facility])  TROPONIN I  HEPARIN LEVEL (UNFRACTIONATED)    EKG EKG  Interpretation  Date/Time:  Saturday November 21 2018 11:17:28 EDT Ventricular Rate:  117 PR Interval:    QRS Duration: 94 QT Interval:  316 QTC Calculation: 441 R Axis:   167 Text Interpretation:  Sinus tachycardia Inferior infarct, acute (RCA) Probable anterolateral infarct, old Probable RV involvement, suggest recording right precordial leads Confirmed by Gwyneth Sproutlunkett, Jecenia Leamer ([id number]) on 11/21/2018 11:34:40 AM   Radiology Dg Chest Port 1 View  Result Date: 11/21/2018 CLINICAL DATA:  Pt c/o chest pains, SOB, sweating, nausea, and jaw pain after doing yard work about 30 minutes ago. Pt hx of CABG and cardiac stents. EXAM: PORTABLE CHEST 1 VIEW COMPARISON:  04/20/2018 FINDINGS: Prior CABG. A focal lordotic projection. The lungs appear clear. No cardiomegaly observed. The left costophrenic angle is clipped. Chronic mild widening of the right AC joint. IMPRESSION: 1. No acute findings. 2. Prior CABG. Electronically Signed   By: Gaylyn RongWalter  Liebkemann M.D.   On: 11/21/2018 12:35    Procedures Procedures (including critical care time)  Medications Ordered in ED Medications  nitroGLYCERIN 50 mg in dextrose 5 % 250 mL (0.2 mg/mL) infusion (has no administration in time range)  heparin bolus via infusion 4,000 Units (has no administration in time range)  heparin ADULT infusion 100 units/mL (25000 units/23550mL sodium chloride 0.45%) (has no administration in time range)  metoprolol tartrate (LOPRESSOR) injection 5 mg (has no administration in time range)  nitroGLYCERIN (NITROSTAT) 0.4 MG SL tablet (0.4 mg  Given 11/21/18 1128)     Initial Impression / Assessment and Plan / ED Course  I have reviewed the triage vital signs and the nursing notes.  Pertinent labs & imaging results that were available during my care of the  patient were reviewed by me and considered in my medical decision making (see chart for details).        48 year old male with significant cardiac history presenting today with chest  pain that started while doing yard work.  He had associated symptoms of radiation into the back and arm with diaphoresis and vomiting.  Did take a full-strength aspirin and nitroglycerin prior to arrival with initial improvement but then return of symptoms.  Here patient appears uncomfortable and is clutching the center of his chest.  He is tachycardic between 10 5-1 20 with stable blood pressure.  Patient was given a sublingual nitroglycerin while IV access was obtained.  EKG initially showed concern for possible inferior infarct however there is significant artifact in rooming baseline.  Second EKG shows possible elevation in the inferior leads but again baseline is roaming and heart rate is fast with sinus tachycardia.  Discussed with Dr. Tresa EndoKelly who recommended continuing the nitro drip for pain as well as a heparin bolus and 5 mg of IV Lopressor to see if slowing the heart rate down we can get a better EKG.  Patient's labs and portable chest x-ray are pending.  Patient is not having any wheezing or characteristics concerning for pneumonia or dissection.  He has no wheezing or signs of fluid overload concerning for COPD exacerbation or CHF.  No prior history of PE and no risk factors.  1:20 PM Patient's blood sugar today is 508 with a normal anion gap without evidence of DKA.  This also could be the result of why he is having frequency and urgency.  COVID is negative, initial troponin is negative.  Spoke with Dr. Shirlee LatchMcLean with cardiology who recommended admitting to the hospitalist service and they can consult if his troponin is positive.  Patient is currently on a heparin drip, nitro drip is still having pain and nitro increased to 10.  Given a bolus for blood sugar and will most likely require insulin.  Chest x-ray without acute findings.  CRITICAL CARE Performed by: Ettamae Barkett Total critical care time: 30 minutes Critical care time was exclusive of separately billable procedures and treating other  patients. Critical care was necessary to treat or prevent imminent or life-threatening deterioration. Critical care was time spent personally by me on the following activities: development of treatment plan with patient and/or surrogate as well as nursing, discussions with consultants, evaluation of patient's response to treatment, examination of patient, obtaining history from patient or surrogate, ordering and performing treatments and interventions, ordering and review of laboratory studies, ordering and review of radiographic studies, pulse oximetry and re-evaluation of patient's condition.   Final Clinical Impressions(s) / ED Diagnoses   Final diagnoses:  Unstable angina [facility]HCC)  Hyperglycemia    ED Discharge Orders    None       Gwyneth SproutPlunkett, Atari Novick, MD 11/21/18 1321

## 2018-11-22 ENCOUNTER — Inpatient Hospital Stay (HOSPITAL_COMMUNITY): Payer: BC Managed Care – PPO

## 2018-11-22 ENCOUNTER — Other Ambulatory Visit: Payer: Self-pay

## 2018-11-22 DIAGNOSIS — IMO0002 Reserved for concepts with insufficient information to code with codable children: Secondary | ICD-10-CM | POA: Diagnosis present

## 2018-11-22 DIAGNOSIS — E785 Hyperlipidemia, unspecified: Secondary | ICD-10-CM | POA: Diagnosis present

## 2018-11-22 DIAGNOSIS — I214 Non-ST elevation (NSTEMI) myocardial infarction: Secondary | ICD-10-CM

## 2018-11-22 DIAGNOSIS — E1165 Type 2 diabetes mellitus with hyperglycemia: Secondary | ICD-10-CM

## 2018-11-22 DIAGNOSIS — G894 Chronic pain syndrome: Secondary | ICD-10-CM

## 2018-11-22 DIAGNOSIS — E118 Type 2 diabetes mellitus with unspecified complications: Secondary | ICD-10-CM

## 2018-11-22 DIAGNOSIS — I200 Unstable angina: Secondary | ICD-10-CM

## 2018-11-22 DIAGNOSIS — E7800 Pure hypercholesterolemia, unspecified: Secondary | ICD-10-CM

## 2018-11-22 LAB — BASIC METABOLIC PANEL
Anion gap: 8 (ref 5–15)
BUN: 17 mg/dL (ref 6–20)
CO2: 24 mmol/L (ref 22–32)
Calcium: 8.9 mg/dL (ref 8.9–10.3)
Chloride: 102 mmol/L (ref 98–111)
Creatinine, Ser: 1.03 mg/dL (ref 0.61–1.24)
GFR calc Af Amer: >60 mL/min (ref >60)
GFR calc non Af Amer: >60 mL/min (ref >60)
Glucose, Bld: 290 mg/dL — ABNORMAL HIGH (ref 70–99)
Potassium: 4.2 mmol/L (ref 3.5–5.1)
Sodium: 134 mmol/L — ABNORMAL LOW (ref 135–145)

## 2018-11-22 LAB — HIV ANTIBODY (ROUTINE TESTING W REFLEX): HIV Screen 4th Generation wRfx: NONREACTIVE

## 2018-11-22 LAB — TROPONIN I
Troponin I: 0.32 ng/mL (ref <0.03)
Troponin I: 0.29 ng/mL (ref <0.03)
Troponin I: 0.29 ng/mL (ref <0.03)

## 2018-11-22 LAB — CBC
HCT: 46.3 % (ref 39.0–52.0)
Hemoglobin: 16.3 g/dL (ref 13.0–17.0)
MCH: 32.7 pg (ref 26.0–34.0)
MCHC: 35.2 g/dL (ref 30.0–36.0)
MCV: 92.8 fL (ref 80.0–100.0)
Platelets: 181 10*3/uL (ref 150–400)
RBC: 4.99 MIL/uL (ref 4.22–5.81)
RDW: 13.2 % (ref 11.5–15.5)
WBC: 15 10*3/uL — ABNORMAL HIGH (ref 4.0–10.5)
nRBC: 0.1 % (ref 0.0–0.2)

## 2018-11-22 LAB — GLUCOSE, CAPILLARY
Glucose-Capillary: 206 mg/dL — ABNORMAL HIGH (ref 70–99)
Glucose-Capillary: 263 mg/dL — ABNORMAL HIGH (ref 70–99)
Glucose-Capillary: 196 mg/dL — ABNORMAL HIGH (ref 70–99)
Glucose-Capillary: 173 mg/dL — ABNORMAL HIGH (ref 70–99)

## 2018-11-22 LAB — MAGNESIUM: Magnesium: 1.8 mg/dL (ref 1.7–2.4)

## 2018-11-22 LAB — MRSA PCR SCREENING: MRSA by PCR: NEGATIVE

## 2018-11-22 LAB — ECHOCARDIOGRAM COMPLETE
Height: 70 in
Weight: 3481.6 oz

## 2018-11-22 LAB — LIPID PANEL
Cholesterol: 204 mg/dL — ABNORMAL HIGH (ref 0–200)
HDL: 32 mg/dL — ABNORMAL LOW (ref >40)
LDL Cholesterol: UNDETERMINED mg/dL (ref 0–99)
Total CHOL/HDL Ratio: 6.4 RATIO
Triglycerides: 585 mg/dL — ABNORMAL HIGH (ref <150)
VLDL: UNDETERMINED mg/dL (ref 0–40)

## 2018-11-22 LAB — HEPARIN LEVEL (UNFRACTIONATED): Heparin Unfractionated: 0.36 IU/mL (ref 0.30–0.70)

## 2018-11-22 LAB — LDL CHOLESTEROL, DIRECT: Direct LDL: 100.3 mg/dL — ABNORMAL HIGH (ref 0–99)

## 2018-11-22 MED ORDER — SODIUM CHLORIDE 0.9% FLUSH: 3.0000 mL | INTRAVENOUS | Status: DC | PRN

## 2018-11-22 MED ORDER — SODIUM CHLORIDE 0.9 % WEIGHT BASED INFUSION
3.0000 mL/kg/h | INTRAVENOUS | Status: AC
  Administered 2018-11-23: 3 mL/kg/h via INTRAVENOUS

## 2018-11-22 MED ORDER — LIVING WELL WITH DIABETES BOOK
Freq: Once | Status: AC
  Administered 2018-11-22: 15:00:00
  Filled 2018-11-22: qty 1

## 2018-11-22 MED ORDER — SODIUM CHLORIDE 0.9 % IV SOLN: 250.0000 mL | INTRAVENOUS | Status: DC | PRN

## 2018-11-22 MED ORDER — HYDROMORPHONE HCL 1 MG/ML IJ SOLN
0.5000 mg | INTRAMUSCULAR | Status: DC | PRN
  Administered 2018-11-22 (×3): 0.5 mg via INTRAVENOUS
  Filled 2018-11-22 (×3): qty 0.5

## 2018-11-22 MED ORDER — INSULIN STARTER KIT- PEN NEEDLES (ENGLISH)
1.0000 | Freq: Once | Status: AC
  Administered 2018-11-22: 1
  Filled 2018-11-22: qty 1

## 2018-11-22 MED ORDER — OXYCODONE-ACETAMINOPHEN 5-325 MG PO TABS
1.0000 | ORAL_TABLET | Freq: Four times a day (QID) | ORAL | Status: DC | PRN
  Administered 2018-11-22 – 2018-11-23 (×3): 1 via ORAL
  Filled 2018-11-22 (×3): qty 1

## 2018-11-22 MED ORDER — INSULIN ASPART 100 UNIT/ML ~~LOC~~ SOLN
5.0000 [IU] | Freq: Three times a day (TID) | SUBCUTANEOUS | Status: DC
  Administered 2018-11-22 – 2018-11-23 (×3): 5 [IU] via SUBCUTANEOUS

## 2018-11-22 MED ORDER — SODIUM CHLORIDE 0.9% FLUSH
3.0000 mL | Freq: Two times a day (BID) | INTRAVENOUS | Status: DC
  Administered 2018-11-22: 3 mL via INTRAVENOUS

## 2018-11-22 MED ORDER — SODIUM CHLORIDE 0.9 % WEIGHT BASED INFUSION: 1.0000 mL/kg/h | INTRAVENOUS | Status: DC

## 2018-11-22 MED ORDER — TRAZODONE HCL 50 MG PO TABS
50.0000 mg | ORAL_TABLET | Freq: Once | ORAL | Status: AC
  Administered 2018-11-22: 23:00:00 50 mg via ORAL
  Filled 2018-11-22: qty 1

## 2018-11-22 MED ORDER — PERFLUTREN LIPID MICROSPHERE
1.0000 mL | INTRAVENOUS | Status: AC | PRN
  Administered 2018-11-22: 2 mL via INTRAVENOUS
  Filled 2018-11-22: qty 10

## 2018-11-22 MED ORDER — OXYCODONE HCL 5 MG PO TABS
5.0000 mg | ORAL_TABLET | ORAL | Status: DC | PRN
  Administered 2018-11-23 (×3): 10 mg via ORAL
  Filled 2018-11-22 (×3): qty 2

## 2018-11-22 NOTE — Progress Notes (Deleted)
PROGRESS NOTE    Kenneth Jones  WUJ:[id number] DOB: Mar 24, 1971 DOA: 11/21/2018 PCP: Patient, No Pcp Per   Brief Narrative:   48 y.o. WM PMHx seizures, diabetes type 2 uncontrolled complication, HLD, tobacco abuse,CAD MI, s/p CABG 2012, s/p stent placement 2015 at [facility] in [place]   Patient reports feeling poorly this week with generalized malaise, weakness from baseline and an episode of dizziness. -This morning he was doing some yard work when he started experiencing sudden onset left-sided chest pain, he felt as though this was pulsating, it radiated down his left arm, to his jaw, was associated with nausea and one episode of vomiting. -Subsequently his wife called EMS and he was brought to the emergency room ED Course: Vital signs were stable, noted to have blood glucose of 508, EKG was abnormal with concern for ST elevation in inferior leads, initially a STEMI call was activated, Dr. Carney Harder nitro drip, heparin bolus and IV Lopressor try to slow down his heart rate to obtain a better EKG. -Subsequently EDP consulted cardiology Dr. Aundra Dubin who recommended admission to [facility] consult to Cardiology if troponin is abnormal    Subjective: 6/14 A/O x4, some mild continued CP, negative S OB, negative diaphoresis, negative radiation of CP.  States the reason that he is seeking care here instead of at [facility] is that he is just moved back to [place].   Assessment & Plan:   Active Problems:   CAD (coronary artery disease)   Chronic pain   Obesity (BMI 30-39.9)   Tobacco abuse   Unstable angina (HCC)   Diabetes mellitus type 2, uncontrolled, with complications ([place])   HLD (hyperlipidemia)   NSTEMI - Patient with significant cardiac history, CABG, PCI; acute onset chest pain. -EKG abnormal with ST-T wave changes in inferior leads - Elevated troponin.  Continue to trend. Recent Labs  Lab 11/21/18 1118 11/21/18 1805  TROPONINI <0.03 0.14*  -Strict in and out -Daily  weight - Aspirin, Plavix,  -Lipitor 40 mg daily  -Echocardiogram pending  Chest pain/unstable angina -Cardiology consulted.  Plan on [place] on Monday   Diabetes type 2 uncontrolled with complication -9/56 hemoglobin A1c= 10.8 - Diabetic coordinator consulted - Diabetic nutritionist consulted -Lantus 20 units daily -NovoLog 5 units QAC - Moderate SSI -Lipid panel pending    Tobacco abuse -Counseled   History of CAD/CABG -CABG in 2012 at [facility] in [place] by PCI at [facility] in [place] in 2015 -We will request records -See discussion above    Chronic pain -Related to his sternotomy from prior CABG -Chronic and stable, continue home regimen of oxycodone -Per patient history is significantly different from current symptoms that brought him to the ER   Obesity -Needs lifestyle modification    DVT prophylaxis: Heparin drip Code Status: Full Family Communication: None Disposition Plan: Cardiology   Consultants:  Cardiology   Procedures/Significant Events:  Echocardiogram pending   I have personally reviewed and interpreted all radiology studies and my findings are as above.  VENTILATOR SETTINGS:    Cultures 6/13 SARS coronavirus negative 6/14 MRSA by PCR negative   Antimicrobials:    Devices    LINES / TUBES:      Continuous Infusions: . heparin 1,300 Units/hr (11/22/18 0200)  . nitroGLYCERIN Stopped (11/22/18 0512)     Objective: Vitals:   11/22/18 0511 11/22/18 0600 11/22/18 0700 11/22/18 0712  BP: 90/63 104/67  107/72  Pulse:   82 82  Resp: 16 18  17   Temp:  98 F (36.7 C) 98 F (36.7 C)  TempSrc:   Oral Oral  SpO2: 95% 94%  95%  Weight:      Height:        Intake/Output Summary (Last 24 hours) at 11/22/2018 0733 Last data filed at 11/22/2018 0100 Gross per 24 hour  Intake 438.31 ml  Output -  Net 438.31 ml   Filed Weights   11/21/18 1116 11/21/18 1148 11/22/18 0006  Weight: 97.5 kg 97.5 kg  98.7 kg    Examination:  General: A/O x4, no acute respiratory distress Eyes: negative scleral hemorrhage, negative anisocoria, negative icterus ENT: Negative Runny nose, negative gingival bleeding, Neck:  Negative scars, masses, torticollis, lymphadenopathy, JVD Lungs: Clear to auscultation bilaterally without wheezes or crackles Cardiovascular: Regular rate and rhythm without murmur gallop or rub normal S1 and S2 Abdomen: negative abdominal pain, nondistended, positive soft, bowel sounds, no rebound, no ascites, no appreciable mass Extremities: No significant cyanosis, clubbing, or edema bilateral lower extremities Skin: Negative rashes, lesions, ulcers Psychiatric:  Negative depression, negative anxiety, negative fatigue, negative mania  Central nervous system:  Cranial nerves II through XII intact, tongue/uvula midline, all extremities muscle strength 5/5, sensation intact throughout, negative dysarthria, negative expressive aphasia, negative receptive aphasia.  .     Data Reviewed: Care during the described time interval was provided by me .  I have reviewed this patient's available data, including medical history, events of note, physical examination, and all test results as part of my evaluation.   CBC: Recent Labs  Lab 11/21/18 1118  WBC 17.6*  HGB 19.2*  HCT 55.2*  MCV 93.7  PLT 235   Basic Metabolic Panel: Recent Labs  Lab 11/21/18 1118  NA 136  K 4.0  CL 99  CO2 23  GLUCOSE 508*  BUN 17  CREATININE 1.18  CALCIUM 9.6   GFR: Estimated Creatinine Clearance: 91.2 mL/min (by C-G formula based on SCr of 1.18 mg/dL). Liver Function Tests: No results for input(s): AST, ALT, ALKPHOS, BILITOT, PROT, ALBUMIN in the last 168 hours. No results for input(s): LIPASE, AMYLASE in the last 168 hours. No results for input(s): AMMONIA in the last 168 hours. Coagulation Profile: No results for input(s): INR, PROTIME in the last 168 hours. Cardiac Enzymes: Recent Labs   Lab 11/21/18 1118 11/21/18 1805  TROPONINI <0.03 0.14*   BNP (last 3 results) No results for input(s): PROBNP in the last 8760 hours. HbA1C: Recent Labs    11/21/18 1118  HGBA1C 10.8*   CBG: Recent Labs  Lab 11/21/18 1635 11/21/18 2217 11/22/18 0703  GLUCAP 266* 199* 206*   Lipid Profile: No results for input(s): CHOL, HDL, LDLCALC, TRIG, CHOLHDL, LDLDIRECT in the last 72 hours. Thyroid Function Tests: No results for input(s): TSH, T4TOTAL, FREET4, T3FREE, THYROIDAB in the last 72 hours. Anemia Panel: No results for input(s): VITAMINB12, FOLATE, FERRITIN, TIBC, IRON, RETICCTPCT in the last 72 hours. Urine analysis:    Component Value Date/Time   COLORURINE YELLOW 11/21/2018 1212   APPEARANCEUR CLEAR 11/21/2018 1212   LABSPEC 1.029 11/21/2018 1212   PHURINE 5.0 11/21/2018 1212   GLUCOSEU >=500 (A) 11/21/2018 1212   HGBUR NEGATIVE 11/21/2018 1212   BILIRUBINUR NEGATIVE 11/21/2018 1212   KETONESUR NEGATIVE 11/21/2018 1212   PROTEINUR NEGATIVE 11/21/2018 1212   NITRITE NEGATIVE 11/21/2018 1212   LEUKOCYTESUR NEGATIVE 11/21/2018 1212   Sepsis Labs: @LABRCNTIP (procalcitonin:4,lacticidven:4)  ) Recent Results (from the past 240 hour(s))  SARS Coronavirus 2 (CEPHEID - Performed in [facility]), [facility]  Order     Status: None   Collection Time: 11/21/18 11:52 AM   Specimen: Nasopharyngeal Swab  Result Value Ref Range Status   SARS Coronavirus 2 NEGATIVE NEGATIVE Final    Comment: (NOTE) If result is NEGATIVE SARS-CoV-2 target nucleic acids are NOT DETECTED. The SARS-CoV-2 RNA is generally detectable in upper and lower  respiratory specimens during the acute phase of infection. The lowest  concentration of SARS-CoV-2 viral copies this assay can detect is 250  copies / mL. A negative result does not preclude SARS-CoV-2 infection  and should not be used as the sole basis for treatment or other  patient management decisions.  A negative result may occur with   improper specimen collection / handling, submission of specimen other  than nasopharyngeal swab, presence of viral mutation(s) within the  areas targeted by this assay, and inadequate number of viral copies  (<250 copies / mL). A negative result must be combined with clinical  observations, patient history, and epidemiological information. If result is POSITIVE SARS-CoV-2 target nucleic acids are DETECTED. The SARS-CoV-2 RNA is generally detectable in upper and lower  respiratory specimens dur ing the acute phase of infection.  Positive  results are indicative of active infection with SARS-CoV-2.  Clinical  correlation with patient history and other diagnostic information is  necessary to determine patient infection status.  Positive results do  not rule out bacterial infection or co-infection with other viruses. If result is PRESUMPTIVE POSTIVE SARS-CoV-2 nucleic acids MAY BE PRESENT.   A presumptive positive result was obtained on the submitted specimen  and confirmed on repeat testing.  While 2019 novel coronavirus  (SARS-CoV-2) nucleic acids may be present in the submitted sample  additional confirmatory testing may be necessary for epidemiological  and / or clinical management purposes  to differentiate between  SARS-CoV-2 and other Sarbecovirus currently known to infect humans.  If clinically indicated additional testing with an alternate test  methodology [phone]([id number]) is advised. The SARS-CoV-2 RNA is generally  detectable in upper and lower respiratory sp ecimens during the acute  phase of infection. The expected result is Negative. Fact Sheet for Patients:  BoilerBrush.com.cyhttps://www.fda.gov/media/136312/download Fact Sheet for Healthcare Providers: https://pope.com/https://www.fda.gov/media/136313/download This test is not yet approved or cleared by the [place] FDA and has been authorized for detection and/or diagnosis of SARS-CoV-2 by FDA under an Emergency Use Authorization (EUA).  This EUA will  remain in effect (meaning this test can be used) for the duration of the COVID-19 declaration under Section 564(b)(1) of the Act, 21 U.S.C. section 360bbb-3(b)(1), unless the authorization is terminated or revoked sooner. Performed at [facility], [address] [place], [place] [address]   MRSA PCR Screening     Status: None   Collection Time: 11/22/18 12:15 AM   Specimen: Nasal Mucosa; Nasopharyngeal  Result Value Ref Range Status   MRSA by PCR NEGATIVE NEGATIVE Final    Comment:        The GeneXpert MRSA Assay (FDA approved for NASAL specimens only), is one component of a comprehensive MRSA colonization surveillance program. It is not intended to diagnose MRSA infection nor to guide or monitor treatment for MRSA infections. Performed at [facility], [address] [place], [place] [address]          Radiology Studies: Dg Chest Port 1 View  Result Date: 11/21/2018 CLINICAL DATA:  Pt c/o chest pains, SOB, sweating, nausea, and jaw pain after doing yard work about 30 minutes ago. Pt hx  of CABG and cardiac stents. EXAM: PORTABLE CHEST 1 VIEW COMPARISON:  04/20/2018 FINDINGS: Prior CABG. A focal lordotic projection. The lungs appear clear. No cardiomegaly observed. The left costophrenic angle is clipped. Chronic mild widening of the right AC joint. IMPRESSION: 1. No acute findings. 2. Prior CABG. Electronically Signed   By: Gaylyn RongWalter  Liebkemann M.D.   On: 11/21/2018 12:35        Scheduled Meds: . aspirin EC  325 mg Oral Daily  . atorvastatin  40 mg Oral q1800  . clopidogrel  75 mg Oral Daily  . insulin aspart  0-15 Units Subcutaneous TID WC  . insulin glargine  20 Units Subcutaneous Daily  . lamoTRIgine  400 mg Oral QHS  . lisinopril  5 mg Oral Daily  . lurasidone  60 mg Oral QHS  . metoprolol tartrate  25 mg Oral BID  . multivitamin with minerals  1 tablet Oral Daily  . oxyCODONE  20 mg Oral Q12H   Continuous Infusions: . heparin 1,300  Units/hr (11/22/18 0200)  . nitroGLYCERIN Stopped (11/22/18 0512)     LOS: 1 day   The patient is critically ill with multiple organ systems failure and requires high complexity decision making for assessment and support, frequent evaluation and titration of therapies, application of advanced monitoring technologies and extensive interpretation of multiple databases. Critical Care Time devoted to patient care services described in this note  Time spent: 40 minutes     Spencer Cardinal, Roselind MessierURTIS J, MD [facility] Pager [phone]  If 7PM-7AM, please contact night-coverage [email or web address] Password [email or web address] 11/22/2018, 7:33 AM

## 2018-11-22 NOTE — Progress Notes (Signed)
Chest pain has improved No ekg changes. CMS negative  Very worrisome for ACS given his prior history.  I would therefore recommend left heart catheterization with possible PCI.  Discussed the cath with the patient. The patient understands that risks included but are not limited to stroke (1 in 1000), death (1 in 1000), kidney failure [usually temporary] (1 in 500), bleeding (1 in 200), allergic reaction [possibly serious] (1 in 200). The patient understands and agrees to proceed.   Will make NPO after midnight and place orders.  Cesilia Shinn MD, FACC [facility] 11/22/2018 11:22 AM     

## 2018-11-22 NOTE — Progress Notes (Signed)
[place] for IV heparin Indication: ACS  Allergies  Allergen Reactions  . Morphine And Related Shortness Of Breath and Itching    Patient Measurements: Height: '5\' 10"'$  (177.8 cm) Weight: 217 lb 9.6 oz (98.7 kg) IBW/kg (Calculated) : 73 Heparin Dosing Weight: 93 kg  Vital Signs: Temp: 98 F (36.7 C) (06/14 1026) Temp Source: Oral (06/14 1026) BP: 114/72 (06/14 1026) Pulse Rate: 70 (06/14 1026)  Labs: Recent Labs    11/21/18 1118 11/21/18 1805 11/22/18 0717 11/22/18 0928  HGB 19.2*  --  16.3  --   HCT 55.2*  --  46.3  --   PLT 235  --  181  --   HEPARINUNFRC  --  0.42 0.36  --   CREATININE 1.18  --   --  1.03  TROPONINI <0.03 0.14*  --  0.32*    Estimated Creatinine Clearance: 104.5 mL/min (by C-G formula based on SCr of 1.03 mg/dL).   Medical History: Past Medical History:  Diagnosis Date  . Diabetes mellitus without complication ([place])   . MI (myocardial infarction) ([place])   . Seizures ([place])     Medications:  Medications Prior to Admission  Medication Sig Dispense Refill Last Dose  . albuterol (PROVENTIL HFA;VENTOLIN HFA) 108 (90 Base) MCG/ACT inhaler Inhale 2 puffs into the lungs every 6 (six) hours as needed for wheezing or shortness of breath. 1 Inhaler 0 Past Month at Unknown time  . aspirin EC 81 MG tablet Take 81 mg by mouth daily.   11/21/2018 at Unknown time  . clopidogrel (PLAVIX) 75 MG tablet Take 75 mg by mouth daily.   11/20/2018 at Unknown time  . ibuprofen (ADVIL,MOTRIN) 200 MG tablet Take 400 mg by mouth daily as needed for moderate pain.    11/20/2018 at Unknown time  . lamoTRIgine (LAMICTAL) 200 MG tablet Take 400 mg by mouth at bedtime.    11/20/2018 at 2100  . lisinopril (PRINIVIL,ZESTRIL) 5 MG tablet Take 5 mg by mouth daily.   11/20/2018 at Unknown time  . Lurasidone HCl (LATUDA) 60 MG TABS Take 1 tablet by mouth at bedtime.   11/20/2018 at Unknown time  . metFORMIN (GLUCOPHAGE) 1000 MG tablet Take 1,000 mg  by mouth daily.   11/21/2018 at Unknown time  . metoprolol tartrate (LOPRESSOR) 25 MG tablet Take 25 mg by mouth daily with supper.   11/20/2018 at 2100  . Multiple Vitamin (MULTIVITAMIN WITH MINERALS) TABS tablet Take 1 tablet by mouth daily.   11/20/2018 at Unknown time  . oxyCODONE (OXYCONTIN) 20 mg 12 hr tablet Take 20 mg by mouth every 12 (twelve) hours.   11/21/2018 at Unknown time  . oxyCODONE-acetaminophen (PERCOCET) 7.5-325 MG tablet Take 1 tablet by mouth every 6 (six) hours as needed for severe pain.    11/21/2018 at Unknown time  . simvastatin (ZOCOR) 80 MG tablet Take 80 mg by mouth at bedtime.   11/20/2018 at Unknown time  . acetaminophen (TYLENOL) 325 MG tablet Take 650 mg by mouth every 6 (six) hours as needed for moderate pain.    unknown  . acetic acid-hydrocortisone (VOSOL-HC) OTIC solution Place 3 drops into both ears 3 (three) times daily. For 5 days (Patient not taking: Reported on 04/12/2018) 10 mL 0 Completed Course at Unknown time  . albuterol (PROVENTIL) (5 MG/ML) 0.5% nebulizer solution Take 0.5 mLs (2.5 mg total) by nebulization every 6 (six) hours as needed for wheezing or shortness of breath. 20 mL 0 unknown  .  benzonatate (TESSALON) 200 MG capsule Take 1 capsule (200 mg total) by mouth 3 (three) times daily as needed. (Patient taking differently: Take 200 mg by mouth 3 (three) times daily as needed for cough. ) 20 capsule 0 unknown  . dextromethorphan-guaiFENesin (MUCINEX DM) 30-600 MG 12hr tablet Take 1 tablet by mouth 2 (two) times daily as needed for cough.   unknown  . diclofenac sodium (VOLTAREN) 1 % GEL Apply 2 g topically 4 (four) times daily as needed (pain).    unknown  . doxycycline (VIBRA-TABS) 100 MG tablet Take 1 tablet (100 mg total) by mouth 2 (two) times daily. (Patient not taking: Reported on 11/21/2018) 20 tablet 0 Completed Course at Unknown time  . pseudoephedrine (SUDAFED) 120 MG 12 hr tablet Take 120 mg by mouth daily as needed for congestion.   unknown    Scheduled:  . aspirin EC  325 mg Oral Daily  . atorvastatin  40 mg Oral q1800  . clopidogrel  75 mg Oral Daily  . insulin aspart  0-15 Units Subcutaneous TID WC  . insulin aspart  5 Units Subcutaneous TID WC  . insulin glargine  20 Units Subcutaneous Daily  . insulin starter kit- pen needles  1 kit Other Once  . lamoTRIgine  400 mg Oral QHS  . lisinopril  5 mg Oral Daily  . living well with diabetes book   Does not apply Once  . lurasidone  60 mg Oral QHS  . metoprolol tartrate  25 mg Oral BID  . multivitamin with minerals  1 tablet Oral Daily  . oxyCODONE  20 mg Oral Q12H  . sodium chloride flush  3 mL Intravenous Q12H    Assessment: 21 yoM with extensive CAD s/p CABG in 2012, PCI 2015, seizures, DM, presenting with classic MI symptoms (CP radiating to left arm, jaw pain, diaphoresis, n/v).Pharmacy to dose heparin for ACS.  Heparin drip 1300 uts/her HL 0.36 at goal.  CBC stable No bleeding noted.  Plan cath 6/15.   Goal of Therapy: Heparin level 0.3-0.7 units/ml Monitor platelets by anticoagulation protocol: Yes  Plan: Continue heparin drip 1300 uts/hr  Daily HL, CBC  Bonnita Nasuti Pharm.D. CPP, BCPS Clinical Pharmacist [phone] 11/22/2018 2:06 PM

## 2018-11-22 NOTE — Progress Notes (Signed)
Inpatient Diabetes Program Recommendations  AACE/ADA: New Consensus Statement on Inpatient Glycemic Control (2015)  Target Ranges:  Prepandial:   less than 140 mg/dL      Peak postprandial:   less than 180 mg/dL (1-2 hours)      Critically ill patients:  140 - 180 mg/dL   Lab Results  Component Value Date   GLUCAP 206 (H) 11/22/2018   HGBA1C 10.8 (H) 11/21/2018    Review of Glycemic Control Results for VIOLET, CART (MRN [id number]) as of 11/22/2018 10:59  Ref. Range 11/21/2018 22:17 11/22/2018 07:03  Glucose-Capillary Latest Ref Range: 70 - 99 mg/dL 199 (H) 206 (H)   Diabetes history: DM 2 Outpatient Diabetes medications:  Metformin 1000 mg daily Current orders for Inpatient glycemic control:  Novolog 5 units tid with meals, Lantus 20 units daily, Novolog moderate tid with meals  Inpatient Diabetes Program Recommendations:    Referral received.  Agree with current orders.  Note that no insurance listed for patient. Will f/u on 6/15 with patient/care manager.  Ordered LWWD booklet and insulin starter kit.   Thanks  Adah Perl, RN, BC-ADM Inpatient Diabetes Coordinator Pager [phone] (8a-5p)

## 2018-11-22 NOTE — Plan of Care (Signed)

## 2018-11-22 NOTE — Progress Notes (Signed)
Pt is in tears; states his pain is not under control.  Rates current CP 8/10.  States his normal home meds of Oxycontin 20 mg BID in addition to Percocet 7.5/325 q6hrs PRN is ineffective for the CP he is now experiencing.  He states he takes his home meds for his "broken" sternum as result of CABG in 2012.  Patient states the Dilaudid 0.5mg  IVP q2hrs only lasts approx 30-40 mins and then the pain returns.  Primary RN, Amina, concerned re: continued IVP pain meds d/t patient BP 80s/50s.  Patient got up and walked in room in effort to raise his BP, now 118/75.  Patient given Percocet 7.5/325mg  PO per PRN order in addition to Dilaudid IVP per this RN, in hope that when the Dilaudid "wears off", the Percocet will sustain the patient's pain until next administration.  Call placed to Dr. Rayann Heman re: pain management until patient can go to the cath lab tomorrow.  Await return call.

## 2018-11-22 NOTE — Consult Note (Signed)
Cardiology Consultation:   Patient ID: Kenneth BorneFrancis E Arruda; [id number]; 08/29/1970   Admit date: 11/21/2018 Date of Consult: 11/22/2018  Primary Care Provider: Patient, No Pcp Per Primary Cardiologist: No primary care provider on file. Primary Electrophysiologist:  None  Chief Complaint: chest pain  Patient Profile:   Kenneth Jones is a 48 y.o. male with a hx of CABG 2012, PCI 2015, HTN, DM, tobacco use who presents with chest pain.  History of Present Illness:   Patient had acute onset chest pain at 11am while doing yard work.  The pain radiated to his arm.  He also felt nauesous.  This felt similar to his symptoms prior to his PCI.  His wife called EMS and he was [facility] ED [facility]  Initial ECG showed minimal ST elevation in the inferior leads, felt not to be diagnostic of STEMI.  Initiation troponin <0.03, which increased to 0.14 on repeat 6 hours later.  He was started on IV heparin and [facility].    Past Medical History:  Diagnosis Date  . Diabetes mellitus without complication (HCC)   . MI (myocardial infarction) (HCC)   . Seizures (HCC)     Past Surgical History:  Procedure Laterality Date  . CARDIAC SURGERY       Inpatient Medications: Scheduled Meds: . aspirin EC  325 mg Oral Daily  . atorvastatin  40 mg Oral q1800  . clopidogrel  75 mg Oral Daily  . insulin aspart  0-15 Units Subcutaneous TID WC  . insulin glargine  20 Units Subcutaneous Daily  . lamoTRIgine  400 mg Oral QHS  . lisinopril  5 mg Oral Daily  . lurasidone  60 mg Oral QHS  . metoprolol tartrate  25 mg Oral BID  . multivitamin with minerals  1 tablet Oral Daily  . oxyCODONE  20 mg Oral Q12H   Continuous Infusions: . heparin 1,300 Units/hr (11/21/18 1313)  . nitroGLYCERIN 10 mcg/min (11/22/18 0057)   PRN Meds: acetaminophen **OR** acetaminophen, albuterol, benzonatate, dextromethorphan-guaiFENesin, ondansetron **OR** ondansetron (ZOFRAN) IV, oxyCODONE-acetaminophen  **AND** oxyCODONE  Home Meds: Prior to Admission medications   Medication Sig Start Date End Date Taking? Authorizing Provider  albuterol (PROVENTIL HFA;VENTOLIN HFA) 108 (90 Base) MCG/ACT inhaler Inhale 2 puffs into the lungs every 6 (six) hours as needed for wheezing or shortness of breath. 04/08/18  Yes Zachery DauerGraham, Elysa, NP  aspirin EC 81 MG tablet Take 81 mg by mouth daily.   Yes [provider]  clopidogrel (PLAVIX) 75 MG tablet Take 75 mg by mouth daily.   Yes [provider]  ibuprofen (ADVIL,MOTRIN) 200 MG tablet Take 400 mg by mouth daily as needed for moderate pain.    Yes [provider]  lamoTRIgine (LAMICTAL) 200 MG tablet Take 400 mg by mouth at bedtime.    Yes [provider]  lisinopril (PRINIVIL,ZESTRIL) 5 MG tablet Take 5 mg by mouth daily.   Yes [provider]  Lurasidone HCl (LATUDA) 60 MG TABS Take 1 tablet by mouth at bedtime.   Yes [provider]  metFORMIN (GLUCOPHAGE) 1000 MG tablet Take 1,000 mg by mouth daily.   Yes [provider]  metoprolol tartrate (LOPRESSOR) 25 MG tablet Take 25 mg by mouth daily with supper.   Yes [provider]  Multiple Vitamin (MULTIVITAMIN WITH MINERALS) TABS tablet Take 1 tablet by mouth daily.   Yes [provider]  oxyCODONE (OXYCONTIN) 20 mg 12 hr tablet Take 20 mg by mouth  every 12 (twelve) hours.   Yes [provider]  oxyCODONE-acetaminophen (PERCOCET) 7.5-325 MG tablet Take 1 tablet by mouth every 6 (six) hours as needed for severe pain.    Yes [provider]  simvastatin (ZOCOR) 80 MG tablet Take 80 mg by mouth at bedtime.   Yes [provider]  acetaminophen (TYLENOL) 325 MG tablet Take 650 mg by mouth every 6 (six) hours as needed for moderate pain.     [provider]  acetic acid-hydrocortisone (VOSOL-HC) OTIC solution Place 3 drops into both ears 3 (three) times daily. For 5 days Patient not taking: Reported on  04/12/2018 11/24/17   Zachery DauerGraham, Elysa, NP  albuterol (PROVENTIL) (5 MG/ML) 0.5% nebulizer solution Take 0.5 mLs (2.5 mg total) by nebulization every 6 (six) hours as needed for wheezing or shortness of breath. 04/12/18   Tilden Fossaees, Elizabeth, MD  benzonatate (TESSALON) 200 MG capsule Take 1 capsule (200 mg total) by mouth 3 (three) times daily as needed. Patient taking differently: Take 200 mg by mouth 3 (three) times daily as needed for cough.  04/08/18   Zachery DauerGraham, Elysa, NP  dextromethorphan-guaiFENesin [facility](MUCINEX DM) 30-600 MG 12hr tablet Take 1 tablet by mouth 2 (two) times daily as needed for cough.    [provider]  diclofenac sodium (VOLTAREN) 1 % GEL Apply 2 g topically 4 (four) times daily as needed (pain).     [provider]  doxycycline (VIBRA-TABS) 100 MG tablet Take 1 tablet (100 mg total) by mouth 2 (two) times daily. Patient not taking: Reported on 11/21/2018 04/08/18   Zachery DauerGraham, Elysa, NP  pseudoephedrine (SUDAFED) 120 MG 12 hr tablet Take 120 mg by mouth daily as needed for congestion.    [provider]    Allergies:    Allergies  Allergen Reactions  . Morphine And Related Shortness Of Breath and Itching    Social History:   Social History   Socioeconomic History  . Marital status: Married    Spouse name: Not on file  . Number of children: Not on file  . Years of education: Not on file  . Highest education level: Not on file  Occupational History  . Not on file  Social Needs  . Financial resource strain: Not on file  . Food insecurity    Worry: Not on file    Inability: Not on file  . Transportation needs    Medical: Not on file    Non-medical: Not on file  Tobacco Use  . Smoking status: Current Every Day Smoker  . Smokeless tobacco: Never Used  Substance and Sexual Activity  . Alcohol use: Not Currently  . Drug use: Not Currently  . Sexual activity: Not on file  Lifestyle  . Physical activity    Days per week: Not on file    Minutes per  session: Not on file  . Stress: Not on file  Relationships  . Social Musicianconnections    Talks on phone: Not on file    Gets together: Not on file    Attends religious service: Not on file    Active member of club or organization: Not on file    Attends meetings of clubs or organizations: Not on file    Relationship status: Not on file  . Intimate partner violence    Fear of current or ex partner: Not on file    Emotionally abused: Not on file    Physically abused: Not on file    Forced sexual activity: Not  on file  Other Topics Concern  . Not on file  Social History Narrative  . Not on file    Family History:   The patient's family history is noncontributory.  ROS:  Please see the history of present illness.  All other ROS reviewed and negative.     Physical Exam/Data:   Vitals:   11/21/18 2218 11/21/18 2242 11/22/18 0006 11/22/18 0057  BP: (!) 99/55 (!) 98/55 90/67 90/62   Pulse:   65 60  Resp:    14  Temp:   97.9 F (36.6 C)   TempSrc:   Oral   SpO2:   97% 93%  Weight:   98.7 kg   Height:   5\' 10"  (1.778 m)     Intake/Output Summary (Last 24 hours) at 11/22/2018 0110 Last data filed at 11/21/2018 1800 Gross per 24 hour  Intake 240 ml  Output -  Net 240 ml   Last 3 Weights 11/22/2018 11/21/2018 11/21/2018  Weight (lbs) 217 lb 9.6 oz 215 lb 215 lb  Weight (kg) 98.703 kg 97.523 kg 97.523 kg    Body mass index is 31.22 kg/m.  General: Well developed, well nourished, in no acute distress. Head: Normocephalic, atraumatic, sclera non-icteric, no xanthomas, nares are without discharge.  Neck: Negative for carotid bruits. JVD not elevated. Lungs: Clear bilaterally to auscultation without wheezes, rales, or rhonchi. Breathing is unlabored. Heart: RRR with S1 S2. No murmurs, rubs, or gallops appreciated. Abdomen: Soft, non-tender, non-distended with normoactive bowel sounds. No hepatomegaly. No rebound/guarding. No obvious abdominal masses. Msk:  Strength and tone appear  normal for age. Extremities: No clubbing or cyanosis. No edema.  Distal pedal pulses are 2+ and equal bilaterally. Neuro: Alert and oriented X 3. No facial asymmetry. No focal deficit. Moves all extremities spontaneously. Psych:  Responds to questions appropriately with a normal affect.  EKG:  The EKG was personally reviewed and demonstrates sinus rhythm with no ischemia.  Relevant CV Studies: None  Laboratory Data:  Chemistry Recent Labs  Lab 11/21/18 1118  NA 136  K 4.0  CL 99  CO2 23  GLUCOSE 508*  BUN 17  CREATININE 1.18  CALCIUM 9.6  GFRNONAA >60  GFRAA >60  ANIONGAP 14    No results for input(s): PROT, ALBUMIN, AST, ALT, ALKPHOS, BILITOT in the last 168 hours. Hematology Recent Labs  Lab 11/21/18 1118  WBC 17.6*  RBC 5.89*  HGB 19.2*  HCT 55.2*  MCV 93.7  MCH 32.6  MCHC 34.8  RDW 13.0  PLT 235   Cardiac Enzymes Recent Labs  Lab 11/21/18 1118 11/21/18 1805  TROPONINI <0.03 0.14*   No results for input(s): TROPIPOC in the last 168 hours.  BNPNo results for input(s): BNP, PROBNP in the last 168 hours.  DDimer No results for input(s): DDIMER in the last 168 hours.  Radiology/Studies:  Dg Chest Port 1 View  Result Date: 11/21/2018 CLINICAL DATA:  Pt c/o chest pains, SOB, sweating, nausea, and jaw pain after doing yard work about 30 minutes ago. Pt hx of CABG and cardiac stents. EXAM: PORTABLE CHEST 1 VIEW COMPARISON:  04/20/2018 FINDINGS: Prior CABG. A focal lordotic projection. The lungs appear clear. No cardiomegaly observed. The left costophrenic angle is clipped. Chronic mild widening of the right AC joint. IMPRESSION: 1. No acute findings. 2. Prior CABG. Electronically Signed   By: Gaylyn RongWalter  Liebkemann M.D.   On: 11/21/2018 12:35    Assessment and Plan:   1. NSTEMI 48 y.o. male with history of CABG  and PCI presenting with acute onset chest pain, found to have elevated troponin consistent with NSTEMI.  Symptoms currently improved after receiving IV  dilaudid.  Will plan LHC Monday, echocardiogram, continue IV heparin.  Pt on plavix at home. -- LHC Monday -- continue heparin -- echo ordered -- IV nitroglycerin for pain control -- continue aspirin, statin, beta blocker -- continue plavix (home medication)   For questions or updates, please contact [place] Please consult [email or web address] for contact info under Cardiology/STEMI.    Signed, Charissa Bash, Sible Straley Chauncey Cruel, MD  11/22/2018 1:10 AM

## 2018-11-22 NOTE — H&P (View-Only) (Signed)
Chest pain has improved No ekg changes. CMS negative  Very worrisome for ACS given his prior history.  I would therefore recommend left heart catheterization with possible PCI.  Discussed the cath with the patient. The patient understands that risks included but are not limited to stroke (1 in 1000), death (1 in 62), kidney failure [usually temporary] (1 in 500), bleeding (1 in 200), allergic reaction [possibly serious] (1 in 200). The patient understands and agrees to proceed.   Will make NPO after midnight and place orders.  Thompson Grayer MD, [facility] 11/22/2018 11:22 AM

## 2018-11-22 NOTE — Progress Notes (Signed)
Cardiology Moonlighter Note  Saw and examined patient. Still having chest pain. Not tolerating nitroglycerin due to headache. ECG without acute changes. Troponin elevated but stable now on 2 checks at 0.29. Plan to cath patient tomorrow. Patient in agreement with continuing home oxycontin, home percocet, and adding oxycodone 5-10mg  every 4 hours as needed for breakthrough pain.   Marcie Mowers, MD Cardiology Fellow, PGY-6

## 2018-11-22 NOTE — Progress Notes (Signed)
  Echocardiogram 2D Echocardiogram has been performed.  Jennette Dubin 11/22/2018, 8:47 AM

## 2018-11-23 ENCOUNTER — Encounter (HOSPITAL_COMMUNITY): Payer: Self-pay | Admitting: Cardiovascular Disease

## 2018-11-23 ENCOUNTER — Telehealth: Payer: Self-pay

## 2018-11-23 ENCOUNTER — Encounter (HOSPITAL_COMMUNITY)
Admission: EM | Discharge status: Against Medical Advice | Payer: Self-pay | Source: Home / Self Care | Attending: Internal Medicine

## 2018-11-23 ENCOUNTER — Other Ambulatory Visit: Payer: Self-pay

## 2018-11-23 DIAGNOSIS — I214 Non-ST elevation (NSTEMI) myocardial infarction: Principal | ICD-10-CM

## 2018-11-23 DIAGNOSIS — I2510 Atherosclerotic heart disease of native coronary artery without angina pectoris: Secondary | ICD-10-CM

## 2018-11-23 DIAGNOSIS — Z5320 Procedure and treatment not carried out because of patient's decision for unspecified reasons: Secondary | ICD-10-CM

## 2018-11-23 HISTORY — PX: LEFT HEART CATH AND CORS/GRAFTS ANGIOGRAPHY: CATH118250

## 2018-11-23 HISTORY — PX: CORONARY STENT INTERVENTION: CATH118234

## 2018-11-23 LAB — BASIC METABOLIC PANEL
Anion gap: 9 (ref 5–15)
BUN: 14 mg/dL (ref 6–20)
CO2: 22 mmol/L (ref 22–32)
Calcium: 8.8 mg/dL — ABNORMAL LOW (ref 8.9–10.3)
Chloride: 107 mmol/L (ref 98–111)
Creatinine, Ser: 0.95 mg/dL (ref 0.61–1.24)
GFR calc Af Amer: >60 mL/min (ref >60)
GFR calc non Af Amer: >60 mL/min (ref >60)
Glucose, Bld: 163 mg/dL — ABNORMAL HIGH (ref 70–99)
Potassium: 4 mmol/L (ref 3.5–5.1)
Sodium: 138 mmol/L (ref 135–145)

## 2018-11-23 LAB — CBC
HCT: 46.5 % (ref 39.0–52.0)
Hemoglobin: 16.6 g/dL (ref 13.0–17.0)
MCH: 32.3 pg (ref 26.0–34.0)
MCHC: 35.7 g/dL (ref 30.0–36.0)
MCV: 90.5 fL (ref 80.0–100.0)
Platelets: 172 10*3/uL (ref 150–400)
RBC: 5.14 MIL/uL (ref 4.22–5.81)
RDW: 12.3 % (ref 11.5–15.5)
WBC: 14.2 10*3/uL — ABNORMAL HIGH (ref 4.0–10.5)
nRBC: 0 % (ref 0.0–0.2)

## 2018-11-23 LAB — GLUCOSE, CAPILLARY
Glucose-Capillary: 226 mg/dL — ABNORMAL HIGH (ref 70–99)
Glucose-Capillary: 202 mg/dL — ABNORMAL HIGH (ref 70–99)
Glucose-Capillary: 312 mg/dL — ABNORMAL HIGH (ref 70–99)

## 2018-11-23 LAB — MAGNESIUM: Magnesium: 1.9 mg/dL (ref 1.7–2.4)

## 2018-11-23 LAB — HEPARIN LEVEL (UNFRACTIONATED): Heparin Unfractionated: 0.29 IU/mL — ABNORMAL LOW (ref 0.30–0.70)

## 2018-11-23 LAB — LIPID PANEL
Cholesterol: 199 mg/dL (ref 0–200)
HDL: 36 mg/dL — ABNORMAL LOW (ref >40)
LDL Cholesterol: UNDETERMINED mg/dL (ref 0–99)
Total CHOL/HDL Ratio: 5.5 RATIO
Triglycerides: 422 mg/dL — ABNORMAL HIGH (ref <150)
VLDL: UNDETERMINED mg/dL (ref 0–40)

## 2018-11-23 LAB — POCT ACTIVATED CLOTTING TIME: Activated Clotting Time: 323 seconds

## 2018-11-23 LAB — LDL CHOLESTEROL, DIRECT: Direct LDL: 106.5 mg/dL — ABNORMAL HIGH (ref 0–99)

## 2018-11-23 SURGERY — LEFT HEART CATH AND CORS/GRAFTS ANGIOGRAPHY
Anesthesia: LOCAL

## 2018-11-23 MED ORDER — TICAGRELOR 90 MG PO TABS: 90.0000 mg | ORAL_TABLET | Freq: Two times a day (BID) | ORAL | 3 refills | Status: DC

## 2018-11-23 MED ORDER — MIDAZOLAM HCL 2 MG/2ML IJ SOLN
INTRAMUSCULAR | Status: AC
  Filled 2018-11-23: qty 2

## 2018-11-23 MED ORDER — NITROGLYCERIN 1 MG/10 ML FOR IR/CATH LAB
INTRA_ARTERIAL | Status: AC
  Filled 2018-11-23: qty 10

## 2018-11-23 MED ORDER — SODIUM CHLORIDE 0.9% FLUSH: 3.0000 mL | Freq: Two times a day (BID) | INTRAVENOUS | Status: DC

## 2018-11-23 MED ORDER — SODIUM CHLORIDE 0.9 % IV SOLN
INTRAVENOUS | Status: DC | PRN
  Administered 2018-11-23: 1.75 mg/kg/h via INTRAVENOUS

## 2018-11-23 MED ORDER — SODIUM CHLORIDE 0.9 % WEIGHT BASED INFUSION
1.0000 mL/kg/h | INTRAVENOUS | Status: DC
  Administered 2018-11-23: 1 mL/kg/h via INTRAVENOUS

## 2018-11-23 MED ORDER — SODIUM CHLORIDE 0.9 % IV SOLN: 250.0000 mL | INTRAVENOUS | Status: DC | PRN

## 2018-11-23 MED ORDER — METOPROLOL TARTRATE 25 MG PO TABS: 25.0000 mg | ORAL_TABLET | Freq: Two times a day (BID) | ORAL | 1 refills | Status: AC

## 2018-11-23 MED ORDER — ATORVASTATIN CALCIUM 80 MG PO TABS: 80.0000 mg | ORAL_TABLET | Freq: Every evening | ORAL | 1 refills | Status: DC

## 2018-11-23 MED ORDER — NITROGLYCERIN 1 MG/10 ML FOR IR/CATH LAB
INTRA_ARTERIAL | Status: DC | PRN
  Administered 2018-11-23: 200 ug via INTRACORONARY

## 2018-11-23 MED ORDER — TICAGRELOR 90 MG PO TABS
ORAL_TABLET | ORAL | Status: DC | PRN
  Administered 2018-11-23: 180 mg via ORAL

## 2018-11-23 MED ORDER — VERAPAMIL HCL 2.5 MG/ML IV SOLN
INTRAVENOUS | Status: AC
  Filled 2018-11-23: qty 2

## 2018-11-23 MED ORDER — LIDOCAINE HCL (PF) 1 % IJ SOLN
INTRAMUSCULAR | Status: AC
  Filled 2018-11-23: qty 30

## 2018-11-23 MED ORDER — LIDOCAINE HCL (PF) 1 % IJ SOLN
INTRAMUSCULAR | Status: DC | PRN
  Administered 2018-11-23: 10 mL via SUBCUTANEOUS

## 2018-11-23 MED ORDER — IOHEXOL 350 MG/ML SOLN
INTRAVENOUS | Status: DC | PRN
  Administered 2018-11-23: 175 mL via INTRACARDIAC

## 2018-11-23 MED ORDER — BIVALIRUDIN BOLUS VIA INFUSION - CUPID
INTRAVENOUS | Status: DC | PRN
  Administered 2018-11-23: 74.25 mg via INTRAVENOUS

## 2018-11-23 MED ORDER — HEPARIN (PORCINE) IN NACL 1000-0.9 UT/500ML-% IV SOLN
INTRAVENOUS | Status: DC | PRN
  Administered 2018-11-23 (×3): 500 mL

## 2018-11-23 MED ORDER — ATORVASTATIN CALCIUM 80 MG PO TABS: 80.0000 mg | ORAL_TABLET | Freq: Every day | ORAL | Status: DC

## 2018-11-23 MED ORDER — TICAGRELOR 90 MG PO TABS: 90.0000 mg | ORAL_TABLET | Freq: Two times a day (BID) | ORAL | Status: DC

## 2018-11-23 MED ORDER — SODIUM CHLORIDE 0.9% FLUSH: 3.0000 mL | INTRAVENOUS | Status: DC | PRN

## 2018-11-23 MED ORDER — LABETALOL HCL 5 MG/ML IV SOLN: 10.0000 mg | INTRAVENOUS | Status: AC | PRN

## 2018-11-23 MED ORDER — MIDAZOLAM HCL 2 MG/2ML IJ SOLN
INTRAMUSCULAR | Status: DC | PRN
  Administered 2018-11-23: 2 mg via INTRAVENOUS
  Administered 2018-11-23: 1 mg via INTRAVENOUS
  Administered 2018-11-23 (×3): 2 mg via INTRAVENOUS
  Administered 2018-11-23: 1 mg via INTRAVENOUS

## 2018-11-23 MED ORDER — ONDANSETRON HCL 4 MG/2ML IJ SOLN: 4.0000 mg | Freq: Four times a day (QID) | INTRAMUSCULAR | Status: DC | PRN

## 2018-11-23 MED ORDER — FENTANYL CITRATE (PF) 100 MCG/2ML IJ SOLN
INTRAMUSCULAR | Status: AC
  Filled 2018-11-23: qty 2

## 2018-11-23 MED ORDER — ASPIRIN 81 MG PO CHEW
81.0000 mg | CHEWABLE_TABLET | Freq: Once | ORAL | Status: AC
  Administered 2018-11-23: 81 mg via ORAL
  Filled 2018-11-23: qty 1

## 2018-11-23 MED ORDER — BIVALIRUDIN TRIFLUOROACETATE 250 MG IV SOLR
INTRAVENOUS | Status: AC
  Filled 2018-11-23: qty 250

## 2018-11-23 MED ORDER — ASPIRIN EC 81 MG PO TBEC: 81.0000 mg | DELAYED_RELEASE_TABLET | Freq: Every day | ORAL | Status: DC

## 2018-11-23 MED ORDER — INSULIN ASPART 100 UNIT/ML ~~LOC~~ SOLN: 10.0000 [IU] | Freq: Three times a day (TID) | SUBCUTANEOUS | Status: DC

## 2018-11-23 MED ORDER — FENTANYL CITRATE (PF) 100 MCG/2ML IJ SOLN
INTRAMUSCULAR | Status: DC | PRN
  Administered 2018-11-23 (×4): 25 ug via INTRAVENOUS
  Administered 2018-11-23 (×2): 50 ug via INTRAVENOUS

## 2018-11-23 MED ORDER — HEPARIN (PORCINE) IN NACL 1000-0.9 UT/500ML-% IV SOLN
INTRAVENOUS | Status: AC
  Filled 2018-11-23: qty 1000

## 2018-11-23 MED ORDER — TICAGRELOR 90 MG PO TABS
ORAL_TABLET | ORAL | Status: AC
  Filled 2018-11-23: qty 2

## 2018-11-23 MED ORDER — INSULIN GLARGINE 100 UNIT/ML ~~LOC~~ SOLN: 25.0000 [IU] | Freq: Every day | SUBCUTANEOUS | Status: DC

## 2018-11-23 MED ORDER — HYDRALAZINE HCL 20 MG/ML IJ SOLN: 10.0000 mg | INTRAMUSCULAR | Status: AC | PRN

## 2018-11-23 SURGICAL SUPPLY — 24 items
BALLN  ~~LOC~~ SAPPHIRE 4.5X18 (BALLOONS) ×1
BALLN SAPPHIRE 2.5X12 (BALLOONS) ×2
BALLN ~~LOC~~ SAPPHIRE 4.5X18 (BALLOONS) ×1
BALLOON SAPPHIRE 2.5X12 (BALLOONS) IMPLANT
BALLOON ~~LOC~~ SAPPHIRE 4.5X18 (BALLOONS) IMPLANT
CATH INFINITI 5 FR LCB (CATHETERS) ×1 IMPLANT
CATH INFINITI 5FR MULTPACK ANG (CATHETERS) ×1 IMPLANT
CATH LAUNCHER 6FR JR4 (CATHETERS) ×1 IMPLANT
DEVICE CLOSURE PERCLS PRGLD 6F (VASCULAR PRODUCTS) IMPLANT
GLIDESHEATH SLEND SS 6F .021 (SHEATH) IMPLANT
GUIDEWIRE INQWIRE 1.5J.035X260 (WIRE) IMPLANT
INQWIRE 1.5J .035X260CM (WIRE)
KIT ENCORE 26 ADVANTAGE (KITS) ×1 IMPLANT
KIT HEART LEFT (KITS) ×2 IMPLANT
PACK CARDIAC CATHETERIZATION (CUSTOM PROCEDURE TRAY) ×2 IMPLANT
PERCLOSE PROGLIDE 6F (VASCULAR PRODUCTS) ×2
SHEATH PINNACLE 5F 10CM (SHEATH) ×1 IMPLANT
SHEATH PINNACLE 6F 10CM (SHEATH) ×1 IMPLANT
STENT RESOLUTE ONYX 4.0X30 (Permanent Stent) ×1 IMPLANT
SYR MEDRAD MARK 7 150ML (SYRINGE) ×2 IMPLANT
TRANSDUCER W/STOPCOCK (MISCELLANEOUS) ×2 IMPLANT
TUBING CIL FLEX 10 FLL-RA (TUBING) ×2 IMPLANT
WIRE COUGAR XT STRL 190CM (WIRE) ×1 IMPLANT
WIRE EMERALD 3MM-J .035X150CM (WIRE) ×1 IMPLANT

## 2018-11-23 NOTE — Interval H&P Note (Signed)
Cath Lab Visit (complete for each Cath Lab visit)  Clinical Evaluation Leading to the Procedure:   ACS: Yes.    Non-ACS:    Anginal Classification: CCS IV  Anti-ischemic medical therapy: Minimal Therapy (1 class of medications)  Non-Invasive Test Results: No non-invasive testing performed  Prior CABG: Previous CABG      History and Physical Interval Note:  11/23/2018 7:44 AM  Kenneth Jones  has presented today for surgery, with the diagnosis of NSTEMI.  The various methods of treatment have been discussed with the patient and family. After consideration of risks, benefits and other options for treatment, the patient has consented to  Procedure(s): LEFT HEART CATH AND CORS/GRAFTS ANGIOGRAPHY (N/A) as a surgical intervention.  The patient's history has been reviewed, patient examined, no change in status, stable for surgery.  I have reviewed the patient's chart and labs.  Questions were answered to the patient's satisfaction.     Sherren Mocha

## 2018-11-23 NOTE — Telephone Encounter (Signed)
**Note De-Identified  Obfuscation** Will call the pt once he is discharged from hospital.

## 2018-11-23 NOTE — Progress Notes (Signed)
Pt left AMA at 1525. MD aware. PIV DC, hemostasis achieved. VSS. [place] Paperwork signed. Pt was being educated by diabetes coordinator on insulin injections. Pt aware he does not have prescriptions or medications. Educated he must take Brilinta twice a day. Pt states he has an appointment already set up with his PCP for tomorrow and "my doctor will just write me a prescription for it then". RN reinforced importance of new medication regimen specifically Brilinta and insulin. Pt states wife is waiting outside.   Pt escorted by NT to private vehicle driven by spouse. Ambulated to front entrance. All belongings sent home with patient, including phone and charger. Pt was unable to find sandals prior to DC, RN did not find in room. Pt left wearing hospital socks.

## 2018-11-23 NOTE — Telephone Encounter (Signed)
-----   Message from Kenneth Jones, [place] sent at 11/23/2018 11:13 AM EDT ----- Regarding: Needs TOC call Pt being discharged either today or tomorrow,  already has f/u planned virtually 6/22 with Scott. Please make sure pt gets TOC call after discharge. Thanks.

## 2018-11-23 NOTE — Discharge Instructions (Signed)
Post Cath Instructions: No driving for 1 week. No lifting over 10 lbs for 2 weeks. No sexual activity for 2 weeks. You may return to work in 1 week. Keep procedure site clean & dry. If you notice increased pain, swelling, bleeding or pus, call/return!  You may shower, but no soaking baths/hot tubs/pools for 1 week.    Do not take any Metformin for at least 48 hours after your heart catheterization. You can restart the morning of 11/26/18.    YOUR CARDIOLOGY TEAM HAS ARRANGED FOR AN E-VISIT FOR YOUR APPOINTMENT - PLEASE REVIEW IMPORTANT INFORMATION BELOW SEVERAL DAYS PRIOR TO YOUR APPOINTMENT  Due to the recent COVID-19 pandemic, we are transitioning in-person office visits to tele-medicine visits in an effort to decrease unnecessary exposure to our patients, their families, and staff. These visits are billed to your insurance just like a normal visit is. We also encourage you to sign up for MyChart if you have not already done so. You will need a smartphone if possible. For patients that do not have this, we can still complete the visit using a regular telephone but do prefer a smartphone to enable video when possible. You may have a family member that lives with you that can help. If possible, we also ask that you have a blood pressure cuff and scale at home to measure your blood pressure, heart rate and weight prior to your scheduled appointment. Patients with clinical needs that need an in-person evaluation and testing will still be able to come to the office if absolutely necessary. If you have any questions, feel free to call our office.   THE DAY OF YOUR APPOINTMENT  Approximately 15 minutes prior to your scheduled appointment, you will receive a telephone call from one of [facility] team - your caller ID may say "Unknown caller."  Our staff will confirm medications, vital signs for the day and any symptoms you may be experiencing. Please have this information available prior to the time of  visit start. It may also be helpful for you to have a pad of paper and pen handy for any instructions given during your visit. They will also walk you through joining the smartphone meeting if this is a video visit.    CONSENT FOR TELE-HEALTH VISIT - PLEASE REVIEW  I hereby voluntarily request, consent and authorize [facility] and its employed or contracted physicians, physician assistants, nurse practitioners or other licensed health care professionals (the Practitioner), to provide me with telemedicine health care services (the Services") as deemed necessary by the treating Practitioner. I acknowledge and consent to receive the Services by the Practitioner via telemedicine. I understand that the telemedicine visit will involve communicating with the Practitioner through live audiovisual communication technology and the disclosure of certain medical information by electronic transmission. I acknowledge that I have been given the opportunity to request an in-person assessment or other available alternative prior to the telemedicine visit and am voluntarily participating in the telemedicine visit.  I understand that I have the right to withhold or withdraw my consent to the use of telemedicine in the course of my care at any time, without affecting my right to future care or treatment, and that the Practitioner or I may terminate the telemedicine visit at any time. I understand that I have the right to inspect all information obtained and/or recorded in the course of the telemedicine visit and may receive copies of available information for a reasonable fee.  I understand that some of the  potential risks of receiving the Services via telemedicine include:   Delay or interruption in medical evaluation due to technological equipment failure or disruption;  Information transmitted may not be sufficient (e.g. poor resolution of images) to allow for appropriate medical decision making by the Practitioner;  and/or   In rare instances, security protocols could fail, causing a breach of personal health information.  Furthermore, I acknowledge that it is my responsibility to provide information about my medical history, conditions and care that is complete and accurate to the best of my ability. I acknowledge that Practitioner's advice, recommendations, and/or decision may be based on factors not within their control, such as incomplete or inaccurate data provided by me or distortions of diagnostic images or specimens that may result from electronic transmissions. I understand that the practice of medicine is not an exact science and that Practitioner makes no warranties or guarantees regarding treatment outcomes. I acknowledge that I will receive a copy of this consent concurrently upon execution via email to the email address I last provided but may also request a printed copy by calling the office of [facility].    I understand that my insurance will be billed for this visit.   I have read or had this consent read to me.  I understand the contents of this consent, which adequately explains the benefits and risks of the Services being provided via telemedicine.   I have been provided ample opportunity to ask questions regarding this consent and the Services and have had my questions answered to my satisfaction.  I give my informed consent for the services to be provided through the use of telemedicine in my medical care  By participating in this telemedicine visit I agree to the above.         CARBOHYDRATE COUNTING FOR PEOPLE WITH DIABETES  Why Is Carbohydrate Counting Important?  Counting carbohydrate servings may help you control your blood glucose level so that you feel better.  The balance between the carbohydrates you eat and insulin determines what your blood glucose level will be after eating.  Carbohydrate counting can also help you plan your meals.  Which Foods Have  Carbohydrates? Foods with carbohydrates include:  Breads, crackers, and cereals  Pasta, rice, and grains  Starchy vegetables, such as potatoes, corn, and peas  Beans and legumes  Milk, soy milk, and yogurt  Fruits and fruit juices  Sweets, such as cakes, cookies, ice cream, jam, and jelly  Carbohydrate Servings In diabetes meal planning, 1 serving of a food with carbohydrate has about 15 grams of carbohydrate:  Check serving sizes with measuring cups and spoons or a food scale.  Read the Nutrition Facts on food labels to find out how many grams of carbohydrate are in foods you eat. The food lists in this handout show portions that have about 15 grams of carbohydrate.  Meal Planning Tips  An Eating Plan tells you how many carbohydrate servings to eat at your meals and snacks. For many adults, eating 3 to 5 servings of carbohydrate foods at each meal and 1 or 2 carbohydrate servings for each snack works well.  In a healthy daily Eating Plan, most carbohydrates come from: ? At least 6 servings of fruits and nonstarchy vegetables ? At least 6 servings of grains, beans, and starchy vegetables, with at least 3 servings from whole grains ? At least 2 servings of milk or milk products  Check your blood glucose level regularly. It can tell you if you  need to adjust when you eat carbohydrates.  Eating foods that have fiber, such as whole grains, and having very few salty foods is good for your health.  Eat 4 to 6 ounces of meat or other protein foods (such as soybean burgers) each day. Choose low-fat sources of protein, such as lean beef, lean pork, chicken, fish, low-fat cheese, or vegetarian foods such as soy.  Eat some healthy fats, such as olive oil, canola oil, and nuts.  Eat very little saturated fats. These unhealthy fats are found in butter, cream, and high-fat meats, such as bacon and sausage.  Eat very little or no trans fats. These unhealthy fats are found in all  foods that list partially hydrogenated oil as an ingredient.  Label Reading Tips The Nutrition Facts panel on a label lists the grams of total carbohydrate in1 standard serving. The labels standard serving may be larger or smaller than 1 carbohydrate serving. To figure out how many carbohydrate servings are in the food:  First, look at the labels standard serving size.  Check the grams of total carbohydrate. This is the amount of carbohydrate in 1 standard serving.  Divide the grams of total carbohydrate by 15. This number equals the number of carbohydrate servings in 1 standard serving. Remember: 1 carbohydrate serving is 15 grams of carbohydrate.  Note: You may ignore the grams of sugars on the Nutrition Facts panel because they are included in the grams of total carbohydrate.   Foods Recommended: 1 serving = about 15 grams of carbohydrate  Starches  1 slice bread (1 ounce)  1 tortilla (6-inch size)   large bagel (1 ounce)  2 taco shells (5-inch size)   hamburger or hot dog bun ( ounce)   cup ready-to-eat unsweetened cereal   cup cooked cereal  1 cup broth-based soup  4 to 6 small crackers  1/3 cup pasta or rice (cooked)   cup beans, peas, corn, sweet potatoes, winter squash, or mashed or boiled potatoes (cooked)   large baked potato (3 ounces)   ounce pretzels, potato chips, or tortilla chips  3 cups popcorn (popped)  Fruit  1 small fresh fruit ( to 1 cup)   cup canned or frozen fruit  2 tablespoons dried fruit (blueberries, cherries, cranberries, mixed fruit, raisins)  17 small grapes (3 ounces)  1 cup melon or berries   cup unsweetened fruit juice  Milk  1 cup fat-free or reduced-fat milk  1 cup soy milk  2/3 cup (6 ounces) nonfat yogurt sweetened with sugar-free sweetener  Sweets and Desserts  2-inch square cake (unfrosted)  2 small cookies (2/3 ounce)   cup ice cream or frozen yogurt   cup sherbet or sorbet  1  tablespoon syrup, jam, jelly, table sugar, or honey  2 tablespoons light syrup  Other Foods  Count 1 cup raw vegetables or  cup cooked nonstarchy vegetables as zero (0) carbohydrate servings or free foods. If you eat 3 or more servings at one meal, count them as 1 carbohydrate serving.  Foods that have less than 20 calories in each serving also may be counted as zero carbohydrate servings or free foods.  Count 1 cup of casserole or other mixed foods as 2 carbohydrate servings.     Carbohydrate Counting for People with Diabetes Sample 1-Day Menu Breakfast 1 extra-small banana (1 carbohydrate serving) 1 cup low-fat or fat-free milk (1 carbohydrate serving) 1 slice whole wheat bread (1 carbohydrate serving) 1 teaspoon margarine  Lunch 2 ounces [place]  slices 2 slices whole wheat bread (2 carbohydrate servings) 2 lettuce leaves 4 celery sticks 4 carrot sticks 1 medium apple (1 carbohydrate serving) 1 cup low-fat or fat-free milk (1 carbohydrate serving)  Afternoon Snack 2 tablespoons raisins (1 carbohydrate serving) 3/4 ounce unsalted mini pretzels (1 carbohydrate serving)  Evening Meal 3 ounces lean roast beef  1/2 large baked potato (2 carbohydrate servings) 1 tablespoon reduced-fat sour cream 1/2 cup green beans 1 tablespoon light salad dressing 1 whole wheat dinner roll (1 carbohydrate serving) 1 teaspoon margarine 1 cup melon balls (1 carbohydrate serving)  Evening Snack 2 tablespoons unsalted nuts

## 2018-11-23 NOTE — Progress Notes (Signed)
Pt still on bedrest. Discussed MI, stent, Brilinta, restrictions, smoking cessation, diet, exercise, NTG, and CRPII. Good reception. He is wanting to make change but is overwhelmed. Encouraged immediate change for his meds and quitting sodas and smoking. He is planning to quit soda but is cutting down with his cigarettes. Encouraged him to make a quit date (he and his wife are quitting together). Gave resources. He understands the importance of Brilinta. Will refer to [place].  Pt is interested in participating in Virtual Cardiac Rehab. Pt advised that Virtual Cardiac Rehab is provided at no cost to the patient.  Checklist:  1. Pt has smart device  ie smartphone and/or ipad for downloading an app  Yes 2. Reliable internet/wifi service    Yes 3. Understands how to use their smartphone and navigate within an app.  Yes  Reviewed with pt the scheduling process for virtual cardiac rehab.  Pt verbalized understanding.  [place], ACSM 2:22 PM 11/23/2018

## 2018-11-23 NOTE — Plan of Care (Signed)

## 2018-11-23 NOTE — Plan of Care (Signed)
Nutrition Education Note  RD consulted for nutrition education regarding diabetes.  Spoke with pt via phone call to room. Pt states that this hospitalization has been a wake-up call for him and his wife. Pt states that he has had DM for "a while" but realizes now that he really needs to make some dietary changes.  Pt states that he has received education from dietitians in the past "but never put the plan into place." Pt states that he has been reluctant to give up his soft drinks and states he typically consumes 2 to 3 large [employer] soft drinks daily. Pt states that his wife has already thrown away all of the soft drinks in their house and that he plans to cut them out completely upon d/c.  Pt states "I don't eat a ton of bad food" and shares that he believes his main issue to be soda. Pt also shares that he was taking Metformin but "I haven't been the best at taking it" and may forget it or skip it from time to time. Pt states that his wife is creating a pill box for him so that he can better remember to take his medications once he gets home.  Pt reports that he plans to only drink water and eat "a cardboard diet" upon d/c. RD reviewed the importance of consistent intake throughout the day as well as the importance of creating a balanced plate.  Lab Results  Component Value Date   HGBA1C 10.8 (H) 11/21/2018   RD will attach  "Carbohydrate Counting for People with Diabetes" handout from the Academy of Nutrition and Dietetics to pt's discharge instructions.  Discussed different food groups and their effects on blood sugar, emphasizing carbohydrate-containing foods. Provided list of carbohydrates and recommended serving sizes of common foods.  Discussed importance of controlled and consistent carbohydrate intake throughout the day. Provided examples of ways to balance meals/snacks and encouraged intake of high-fiber, whole grain complex carbohydrates. Teach back method used.  Expect good  compliance.  Body mass index is 31.32 kg/m. Pt meets criteria for obesity class I based on current BMI.  Current diet order is Carb Modified, patient is consuming approximately 100% of meals at this time. Labs and medications reviewed. No further nutrition interventions warranted at this time. RD contact information provided. If additional nutrition issues arise, please re-consult RD.   Gaynell Face, MS, RD, LDN Inpatient Clinical Dietitian Pager: [phone] Weekend/After Hours: [phone]

## 2018-11-23 NOTE — Care Management (Signed)
CM requests that discharge prescriptions be sent to [facility] - CM will assess for MATCH needs at discharge. Pt declined for CM make appt with clinic for PCP follow [place] medication assitance - pt informed CM that he already has an appt with his PCP in [place] (pt used to live there but now resides in [place]).  Pt also informed CM that he will have active insurance within the next two weeks

## 2018-11-23 NOTE — Progress Notes (Signed)
Patient transported to cath lab

## 2018-11-23 NOTE — Progress Notes (Addendum)
   Dr. Burt Knack indicates he sent a message to Dr. Sherral Hammers to let him know patient would be OK for discharge from cardiology standpoint today. Per discussion with Dr Burt Knack, virtual f/u arranged 6/22 with Richardson Dopp PA-C. I added .hcevisitinfo to AVS. Patient elects video type. Instructions reviewed and ph # added to appt notes. I also discussed consent with the patient and he agrees to proceed. MyChart signup also sent to phone. I sent message to [facility] pool to make sure he gets [facility] phone call. I also included post-cath instructions on AVS. He should not restart Metformin for 48 hours post-cath - he can restart the morning of 11/26/18. He is not actively working but would be cleared to return in 1 week if feeling well. The patient inquired if Dr. Burt Knack would be able to take him on as a new patient. Dr. Burt Knack agreed with the patient understanding it would be a partnership seeing Richardson Dopp PA-C on a regular basis as well.   Cardiac rehab will need to see pt prior to leaving, phase 1 has been ordered and I touched base with their team to make them aware.  I discussed option of meds-to-bed pharmacy program with pt but he declined, wishing to use his regular pharmacy instead. Care management consult to assure no barriers to Brilinta. I routed his cardiology rx (metoprolol, atorvastatin and Brilinta) to the [facility] on [address] per his request.  Melina Copa PA-C

## 2018-11-23 NOTE — Progress Notes (Signed)
Inpatient Diabetes Program   AACE/ADA: New Consensus Statement on Inpatient Glycemic Control (2015)  Target Ranges:  Prepandial:   less than 140 mg/dL      Peak postprandial:   less than 180 mg/dL (1-2 hours)      Critically ill patients:  140 - 180 mg/dL   Lab Results  Component Value Date   GLUCAP 312 (H) 11/23/2018   HGBA1C 10.8 (H) 11/21/2018    Spoke with patient about diabetes and home regimen for diabetes control. Patient reports that he was not taking metformin regularly. Patient reports he is leaving due to hospital bill and unsure if insurance will cover. Discussed with patient the need for Hospitalist to watch his glucose for insulin dosing. Patient understands and still wishes to leave.  Patient reports having his A1c down to around 7% however was not doing what he should. Patient has been on an injection in the past after his heart surgery.  Discussed current A1c. Discussed glucose control only on metformin at that level. Emphasized PCP follow up. Patient reports seeing his PCP tomorrow. Patient reports he called his PCP and he will be checking glucose 4x/day  Showed patient vial and syringe ad insulin pen. Patient unsure if [facility] insurance will cover. Discussed [facility] back up insulin to be dosed by PCP.  Gave patient blood sugar diary to record his glucose. Patient thankful for information. RN and Nurse tech arrived in room to d/c patient.  Thanks,  Tama Headings RN, MSN, BC-ADM Inpatient Diabetes Coordinator Team Pager [phone] (8a-5p)

## 2018-11-24 ENCOUNTER — Telehealth: Payer: Self-pay

## 2018-11-24 ENCOUNTER — Telehealth: Payer: Self-pay | Admitting: Physician Assistant

## 2018-11-24 MED ORDER — TICAGRELOR 90 MG PO TABS: 90.0000 mg | ORAL_TABLET | Freq: Two times a day (BID) | ORAL | 3 refills | Status: AC

## 2018-11-24 MED FILL — Verapamil HCl IV Soln 2.5 MG/ML: INTRAVENOUS | Qty: 2 | Status: AC

## 2018-11-24 NOTE — Telephone Encounter (Signed)
Patient contacted regarding discharge (pt left AMA but did not elaborate as to why) from [facility] on 11/23/2018.  Patient understands to follow up with provider Richardson Dopp, PA-c on 11/30/2018 at 1:45 for a virtual visit Patient understands discharge instructions? Yes Patient understands medications and regiment? Yes Patient understands to bring all medications to this visit? Yes  The pt states that he is doing well and is having no CP, SOB, dizziness or nausea. He reports that his cath site is ok and is without any pain, fever, swelling, redness or drainage. He states that he is in [place], [place] at this time staying with his mother so he is not alone.

## 2018-11-24 NOTE — Telephone Encounter (Signed)
I spoke to the patient and informed him of the importance that he goes to [facility] now to start his medication.  He has the 30 day card to get him started until the insurance is resolved.  He verbalized understanding of importance and was heading over to pharmacy now.

## 2018-11-24 NOTE — Telephone Encounter (Signed)
   Followed up chart as patient was on our rounding list from yesterday, appears he left AMA from IM service, unclear what happened. Dr. Burt Knack had cleared him to go home post-cath if he remained stable. Cardiology team D was not notified. When I had spoken with the patient yesterday he requested his new prescriptions be sent to [facility] at [place] which I had done so that he had access to refills as well. He was aware at the time of our discussion that these were being sent there. He refused to use [facility] pharmacy per our discussion. However, afternoon notes from care management recommended prescriptions be sent to [place]. Unclear from current chart what happened from then on. Importance of med compliance particularly Brilinta was reinforced by cardiac rehab. I tried to call patient this AM to find out what happened and to make sure he was able to get his Brilinta, but got his VM. I LMOM for him to call the office back to speak with Triage to inquire about ability to pick up meds that were prescribed to him. I will forward to Triage to see if we have samples of Brilinta as well that he can pick up ASAP and to reach out to patient again this AM. He absolutely needs to understand that he cannot miss Brilinta. If Brilinta cost is an issue, will need to review switching to Plavix with Dr. Burt Knack.  Aryanna Shaver PA-C

## 2018-11-24 NOTE — Telephone Encounter (Addendum)
[name] Dapp documented the following in separate phone note:   "I spoke to the patient and informed him of the importance that he goes to [facility] now to start his medication.  He has the 30 day card to get him started until the insurance is resolved.  He verbalized understanding of importance and was heading over to pharmacy now."

## 2018-11-24 NOTE — Telephone Encounter (Signed)
I spoke to the patient who will pick up his Brilinta 90 mg today at [facility] on [address].  I told him to call, if he has problems.  He verbalized understanding.

## 2018-11-24 NOTE — Telephone Encounter (Addendum)
Saw verbal order come through this AM for duplicate Brilinta rx. This was sent in yesterday to same pharmacy for same sig (#180 with 3 refills), therefore should not need repeat rx. I also sent in metoprolol and atorvastatin yesterday as well and told the patient I had done so. [name] indicates patient told him he has plans to pick up prescription this afternoon and was advised to call if any issues getting rx. I am very concerned that [facility] is not taking the urgency of the Brilinta seriously despite education around this - I asked [name] to please call pt immediately and advise per our previous instructions he cannot miss ANY DOSES of Brilinta or he is at risk of having a heart attack or dying. I asked [name] to call [facility] to assess cost and discuss with patient ASAP.   As below, if cost is an issue needs samples ASAP and discussion with Dr. Burt Knack about Plavix. Tranice Laduke PA-C

## 2018-11-24 NOTE — Discharge Summary (Signed)
Patient stated he refused to remain overnight to have his medication adjusted.  I requested that his RN contact nurse case manager to come to bedside to ensure that he had cardiac and diabetic medication to take home with him and then if he still insisted on leaving in an unsafe manner it would be AMA. Patient refused to wait for nurse case manager to arrive to his bed in order to obtain take home medication and stated he would get it from his PCP tomorrow and left AMA.

## 2018-11-30 ENCOUNTER — Telehealth: Payer: Self-pay | Admitting: *Deleted

## 2018-11-30 ENCOUNTER — Encounter: Payer: Self-pay | Admitting: Physician Assistant

## 2018-11-30 ENCOUNTER — Other Ambulatory Visit: Payer: Self-pay

## 2018-11-30 ENCOUNTER — Telehealth: Payer: Self-pay | Admitting: Physician Assistant

## 2018-11-30 ENCOUNTER — Telehealth (INDEPENDENT_AMBULATORY_CARE_PROVIDER_SITE_OTHER): Payer: Self-pay | Admitting: Physician Assistant

## 2018-11-30 VITALS — BP 110/72 | HR 110 | Ht 70.0 in | Wt 215.0 lb

## 2018-11-30 DIAGNOSIS — Z720 Tobacco use: Secondary | ICD-10-CM

## 2018-11-30 DIAGNOSIS — R509 Fever, unspecified: Secondary | ICD-10-CM

## 2018-11-30 DIAGNOSIS — E119 Type 2 diabetes mellitus without complications: Secondary | ICD-10-CM

## 2018-11-30 DIAGNOSIS — I214 Non-ST elevation (NSTEMI) myocardial infarction: Secondary | ICD-10-CM

## 2018-11-30 DIAGNOSIS — E1169 Type 2 diabetes mellitus with other specified complication: Secondary | ICD-10-CM

## 2018-11-30 DIAGNOSIS — I10 Essential (primary) hypertension: Secondary | ICD-10-CM

## 2018-11-30 DIAGNOSIS — E785 Hyperlipidemia, unspecified: Secondary | ICD-10-CM

## 2018-11-30 DIAGNOSIS — Z7189 Other specified counseling: Secondary | ICD-10-CM

## 2018-11-30 NOTE — Telephone Encounter (Signed)
New message:     Patient calling to see if e can change his appt back to a virtual appt. Patient states he had a fever last night not feeling to well.

## 2018-11-30 NOTE — Patient Instructions (Signed)
Medication Instructions:   Continue to take Metoprolol  25 mg twice a day   If you need a refill on your cardiac medications before your next appointment, please call your pharmacy.   Lab work: Fasting labs  Lipids and LFT   If you have labs (blood work) drawn today and your tests are completely normal, you will receive your results only by: Marland Kitchen MyChart Message (if you have MyChart) OR . A paper copy in the mail If you have any lab test that is abnormal or we need to change your treatment, we will call you to review the results.  Testing/Procedures: NONE ORDERED  TODAY    Follow-Up: In 6 to 8 weeks with Richardson Dopp   Any Other Special Instructions Will Be Listed Below (If Applicable).

## 2018-11-30 NOTE — Progress Notes (Signed)
Virtual Visit via Video Note   This visit type was conducted due to national recommendations for restrictions regarding the COVID-19 Pandemic (e.g. social distancing) in an effort to limit this patient's exposure and mitigate transmission in our community.  Due to his co-morbid illnesses, this patient is at least at moderate risk for complications without adequate follow up.  This format is felt to be most appropriate for this patient at this time.  All issues noted in this document were discussed and addressed.  A limited physical exam was performed with this format.  Please refer to the patient's chart for his consent to telehealth for [facility].   Date:  11/30/2018   ID:  Kenneth Jones, DOB 09/01/1970, MRN [id number]  Patient Location: Home Provider Location: Office  PCP:  Patient, No Pcp Per  Cardiologist:  Tonny BollmanMichael Cooper, MD   Electrophysiologist:  None   Evaluation Performed:  Follow-Up Visit  Chief Complaint:  Post hospital follow up s/p NSTEMI tx with PCI  History of Present Illness:    Kenneth Jones is a 48 y.o. male with:  Coronary artery disease  CABG in 2012  PCI with stent to the native RCA in 2015  NSTEMI 11/2018: DES to the RCA  Hypertension  Hyperlipidemia  Diabetes mellitus  Tobacco   Seizure disorder  He was admitted 6/13-6/15 with a non-ST elevation myocardial infarction.  Cardiac catheterization demonstrated severe native three-vessel CAD with severe left main stenosis, total occlusion of the LAD and total occlusion of the LCx and severe stenosis of a large dominant RCA.  The previous stent in the RCA was patent.  The LIMA-DX, RIMA-OM were patent.  The vein graft to the LAD was occluded.  The patient underwent PCI with a DES to the RCA.  Of note, he left the hospital AMA.  Today, he notes that his anginal symptoms are resolved.  He has had some chronic chest pain since his bypass.  These symptoms have persisted for years without change.  He does  note some shortness of breath at times.  He has not had orthopnea, lower extremity swelling.  He has not had syncope.  He has had some dizziness at times.  He does feel his heart racing at times.  He is trying to quit smoking.  He has been more diligent about his diabetic management.  He does have some decreased energy.  The patient does not have symptoms concerning for COVID-19 infection (fever, chills, cough, or new shortness of breath).    Past Medical History:  Diagnosis Date  . Diabetes mellitus without complication (HCC)   . MI (myocardial infarction) (HCC)   . Seizures (HCC)    Past Surgical History:  Procedure Laterality Date  . CARDIAC SURGERY    . CORONARY STENT INTERVENTION N/A 11/23/2018   Procedure: CORONARY STENT INTERVENTION;  Surgeon: Tonny Bollmanooper, Michael, MD;  [facility];  Service: Cardiovascular;  Laterality: N/A;  . LEFT HEART CATH AND CORS/GRAFTS ANGIOGRAPHY N/A 11/23/2018   Procedure: LEFT HEART CATH AND CORS/GRAFTS ANGIOGRAPHY;  Surgeon: Tonny Bollmanooper, Michael, MD;  [facility];  Service: Cardiovascular;  Laterality: N/A;     Current Meds  Medication Sig  . acetaminophen (TYLENOL) 325 MG tablet Take 650 mg by mouth every 6 (six) hours as needed for moderate pain.   Marland Kitchen. albuterol (PROVENTIL HFA;VENTOLIN HFA) 108 (90 Base) MCG/ACT inhaler Inhale 2 puffs into the lungs every 6 (six) hours as needed for wheezing or shortness of breath.  .Marland Kitchen  albuterol (PROVENTIL) (5 MG/ML) 0.5% nebulizer solution Take 0.5 mLs (2.5 mg total) by nebulization every 6 (six) hours as needed for wheezing or shortness of breath.  Marland Kitchen. aspirin EC 81 MG tablet Take 81 mg by mouth daily.  . clonazePAM (KLONOPIN) 1 MG tablet Take 1 mg by mouth once as needed.  Marland Kitchen. dextromethorphan-guaiFENesin (MUCINEX DM) 30-600 MG 12hr tablet Take 1 tablet by mouth 2 (two) times daily as needed for cough.  . diclofenac sodium (VOLTAREN) 1 % GEL Apply 2 g topically 4 (four) times daily as needed (pain).    Marland Kitchen. empagliflozin (JARDIANCE) 10 MG TABS tablet Take 10 mg by mouth daily.  Marland Kitchen. ibuprofen (ADVIL,MOTRIN) 200 MG tablet Take 400 mg by mouth daily as needed for moderate pain.   Marland Kitchen. lamoTRIgine (LAMICTAL) 200 MG tablet Take 400 mg by mouth at bedtime.   Marland Kitchen. lisinopril (PRINIVIL,ZESTRIL) 5 MG tablet Take 5 mg by mouth daily.  . Lurasidone HCl (LATUDA) 60 MG TABS Take 1 tablet by mouth at bedtime.  . metFORMIN (GLUCOPHAGE) 1000 MG tablet Take 1,000 mg by mouth daily.  . metoprolol tartrate (LOPRESSOR) 25 MG tablet Take 1 tablet (25 mg total) by mouth 2 (two) times daily.  . Multiple Vitamin (MULTIVITAMIN WITH MINERALS) TABS tablet Take 1 tablet by mouth daily.  . nitroGLYCERIN (NITROSTAT) 0.4 MG SL tablet Place 0.4 mg under the tongue as needed.  Marland Kitchen. oxyCODONE (OXYCONTIN) 20 mg 12 hr tablet Take 20 mg by mouth every 12 (twelve) hours.  Marland Kitchen. oxyCODONE-acetaminophen (PERCOCET) 7.5-325 MG tablet Take 1 tablet by mouth every 6 (six) hours as needed for severe pain.   . pseudoephedrine (SUDAFED) 120 MG 12 hr tablet Take 120 mg by mouth daily as needed for congestion.  . simvastatin (ZOCOR) 80 MG tablet Take 80 mg by mouth daily.  . tamsulosin (FLOMAX) 0.4 MG CAPS capsule Take 0.4 mg by mouth daily.  . ticagrelor (BRILINTA) 90 MG TABS tablet Take 1 tablet (90 mg total) by mouth 2 (two) times daily.  . [DISCONTINUED] atorvastatin (LIPITOR) 80 MG tablet Take 1 tablet (80 mg total) by mouth every evening.  . [DISCONTINUED] metoprolol succinate (TOPROL-XL) 25 MG 24 hr tablet Take 25 mg by mouth daily.     Allergies:   Morphine and related   Social History   Tobacco Use  . Smoking status: Current Every Day Smoker  . Smokeless tobacco: Never Used  Substance Use Topics  . Alcohol use: Not Currently  . Drug use: Not Currently     Family Hx: The patient's family history is not on file.  ROS:   Please see the history of present illness.     All other systems reviewed and are negative.   Prior CV studies:    The following studies were reviewed today:  Cardiac catheterization 11/23/2018 LM ostial 70 LAD ostial 100 LCx proximal 100 RCA proximal 80, thrombotic, distal RCA stent patent RIMA-OM1 patent SVG-mid LAD 100 LIMA-D1 patent PCI: 4 x 30 mm resolute Onyx DES to the proximal-mid RCA  Echocardiogram 11/22/2018 EF 55-60, apex looks hypokinetic but appears normal with echo contrast  Labs/Other Tests and Data Reviewed:    EKG:  No ECG reviewed.  Recent Labs: 04/20/2018: ALT 43 11/23/2018: BUN 14; Creatinine, Ser 0.95; Hemoglobin 16.6; Magnesium 1.9; Platelets 172; Potassium 4.0; Sodium 138   Recent Lipid Panel Lab Results  Component Value Date/Time   CHOL 199 11/23/2018 03:01 AM   TRIG 422 (H) 11/23/2018 03:01 AM   HDL 36 (L) 11/23/2018 03:01  AM   CHOLHDL 5.5 11/23/2018 03:01 AM   LDLCALC UNABLE TO CALCULATE IF TRIGLYCERIDE OVER 400 mg/dL 11/23/2018 03:01 AM   LDLDIRECT 106.5 (H) 11/23/2018 03:01 AM    Wt Readings from Last 3 Encounters:  11/30/18 215 lb (97.5 kg)  11/23/18 218 lb 4.1 oz (99 kg)  04/20/18 220 lb (99.8 kg)     Objective:    Vital Signs:  BP 110/72   Pulse (!) 110   Ht 5\' 10"  (1.778 m)   Wt 215 lb (97.5 kg)   BMI 30.85 kg/m    VITAL SIGNS:  reviewed GEN:  no acute distress EYES:  sclerae anicteric RESPIRATORY:  normal respiratory effort NEURO:  alert and oriented x 3, no obvious focal deficit PSYCH:  normal affect  ASSESSMENT & PLAN:    1. NSTEMI (non-ST elevation myocardial infarction) ([place]) History of CABG in 2012 and subsequent PCI with DES to the RCA in 2015.  His previous cardiologist was in [place], [place].  He recently moved to [place] but had not yet established with anyone.  Recent admission with NSTEMI treated with DES to the RCA.  Since discharge from the hospital, he has been doing well without recurrent angina.  He does have some shortness of breath seems to be related to ticagrelor.  I have reassured him that this should gradually  improve over time.  If it does not, he should let us know.  I guess we could consider changing him to Effient at that point.  Continue aspirin, statin, beta-blocker, ACE inhibitor.  His heart rate is recorded at 110 today.  He has only been taking metoprolol tartrate once a day.  I have asked him to increase this to twice a day and will bring him in the next week or 2 for an EKG.  2. Essential hypertension His blood pressure is controlled.  3. Hyperlipidemia, unspecified hyperlipidemia type Recent lipid panel with significantly elevated triglycerides.  He has been on simvastatin 80 mg for years.  Continue current therapy.  Arrange follow-up lipids and LFTs in 6 to 8 weeks.  If his lipids remain uncontrolled, consider switching to rosuvastatin before adding another agent to treat triglycerides.  4. Type 2 diabetes mellitus with hyperlipidemia ([place]) He is working on managing his diabetes.  He is currently on empagliflozin.  Recent hemoglobin A1c 10.8.  5.  Fever He had a low-grade temperature yesterday.  He has not had any evidence of fever today.  He denies any cough or significant change in his breathing.  I have encouraged him to follow-up with his primary care provider should he continue to run a fever.  Currently, his symptoms do not sound consistent with COVID-19.  6. Tobacco abuse He is trying to quit.  7.  COVID-19 Education: The signs and symptoms of COVID-19 were discussed with the patient and how to seek care for testing (follow up with PCP or arrange E-visit).  The importance of social distancing was discussed today.  Time:   Today, I have spent 15  minutes with the patient with telehealth technology discussing the above problems.     Medication Adjustments/Labs and Tests Ordered: Current medicines are reviewed at length with the patient today.  Concerns regarding medicines are outlined above.   Tests Ordered: Orders Placed This Encounter  Procedures  . Hepatic function panel   . Lipid panel    Medication Changes: No orders of the defined types were placed in this encounter.   Follow Up:  In Person 6-8 weeks  with Dr. Excell Seltzerooper or Tereso NewcomerScott Jennae Hakeem, PA-C  Signed, Tereso NewcomerScott Maisyn Nouri, PA-C  11/30/2018 4:33 PM    [facility]

## 2018-11-30 NOTE — Telephone Encounter (Signed)
Spoke with patient to attempt to make 6 to 8 week follow up with Scott and fasting labs that same day  Week of 01-25-19   An Ekg in 1 to 2 weeks  Week of Jul 6 on a day Weaver in office    Patient stated hes' busy at the moment and would call us back with confirmation

## 2018-12-01 ENCOUNTER — Encounter (HOSPITAL_COMMUNITY): Payer: Self-pay

## 2018-12-01 ENCOUNTER — Telehealth (HOSPITAL_COMMUNITY): Payer: Self-pay

## 2018-12-01 NOTE — Telephone Encounter (Signed)
No response from pt,  message left on voicemail regarding Virtual Cardiac Rehab for the continuation of his participation.    Please contact letter sent to address on file in epic. Await response, if no response by December 15, 2018 will discharge pt from the program.

## 2018-12-01 NOTE — Telephone Encounter (Signed)
Dr.  Cooper ° °As you are aware our department remains closed to patients due to Covid-19.  We are excited to be able to offer an alternative to traditional onsite Cardiac Rehab while your patient continues to follow Re-Open guidelines. This is a notification that your patient has been contacted and is very interested in participating in Virtual Cardiac Rehab. ° °Thank you for your continued support in helping us meet the health care needs of our patients. ° °Jessica ° °Cardiac Rehab staff °

## 2018-12-02 ENCOUNTER — Ambulatory Visit: Payer: Self-pay | Admitting: Family Medicine

## 2018-12-17 ENCOUNTER — Other Ambulatory Visit: Payer: Self-pay

## 2018-12-17 ENCOUNTER — Encounter (HOSPITAL_COMMUNITY): Payer: Self-pay

## 2018-12-17 ENCOUNTER — Other Ambulatory Visit (HOSPITAL_COMMUNITY): Payer: Self-pay

## 2018-12-17 ENCOUNTER — Observation Stay (HOSPITAL_COMMUNITY)
Admission: EM | Admit: 2018-12-17 | Discharge: 2018-12-18 | Disposition: A | Discharge status: Home / Self Care | Payer: BC Managed Care – PPO | Attending: Internal Medicine | Admitting: Internal Medicine

## 2018-12-17 ENCOUNTER — Emergency Department (HOSPITAL_COMMUNITY): Payer: BC Managed Care – PPO

## 2018-12-17 DIAGNOSIS — E1169 Type 2 diabetes mellitus with other specified complication: Secondary | ICD-10-CM | POA: Diagnosis present

## 2018-12-17 DIAGNOSIS — R569 Unspecified convulsions: Secondary | ICD-10-CM

## 2018-12-17 DIAGNOSIS — Z955 Presence of coronary angioplasty implant and graft: Secondary | ICD-10-CM | POA: Insufficient documentation

## 2018-12-17 DIAGNOSIS — Z1159 Encounter for screening for other viral diseases: Secondary | ICD-10-CM | POA: Insufficient documentation

## 2018-12-17 DIAGNOSIS — G8929 Other chronic pain: Secondary | ICD-10-CM | POA: Diagnosis not present

## 2018-12-17 DIAGNOSIS — R45850 Homicidal ideations: Secondary | ICD-10-CM | POA: Insufficient documentation

## 2018-12-17 DIAGNOSIS — Z951 Presence of aortocoronary bypass graft: Secondary | ICD-10-CM | POA: Diagnosis not present

## 2018-12-17 DIAGNOSIS — F10129 Alcohol abuse with intoxication, unspecified: Secondary | ICD-10-CM | POA: Insufficient documentation

## 2018-12-17 DIAGNOSIS — Z794 Long term (current) use of insulin: Secondary | ICD-10-CM | POA: Insufficient documentation

## 2018-12-17 DIAGNOSIS — F1290 Cannabis use, unspecified, uncomplicated: Secondary | ICD-10-CM | POA: Diagnosis not present

## 2018-12-17 DIAGNOSIS — Z791 Long term (current) use of non-steroidal anti-inflammatories (NSAID): Secondary | ICD-10-CM | POA: Insufficient documentation

## 2018-12-17 DIAGNOSIS — Y909 Presence of alcohol in blood, level not specified: Secondary | ICD-10-CM | POA: Diagnosis not present

## 2018-12-17 DIAGNOSIS — E669 Obesity, unspecified: Secondary | ICD-10-CM | POA: Insufficient documentation

## 2018-12-17 DIAGNOSIS — Z888 Allergy status to other drugs, medicaments and biological substances status: Secondary | ICD-10-CM | POA: Insufficient documentation

## 2018-12-17 DIAGNOSIS — Z6830 Body mass index (BMI) 30.0-30.9, adult: Secondary | ICD-10-CM | POA: Diagnosis not present

## 2018-12-17 DIAGNOSIS — I2510 Atherosclerotic heart disease of native coronary artery without angina pectoris: Secondary | ICD-10-CM | POA: Diagnosis present

## 2018-12-17 DIAGNOSIS — R079 Chest pain, unspecified: Secondary | ICD-10-CM | POA: Diagnosis present

## 2018-12-17 DIAGNOSIS — Z885 Allergy status to narcotic agent status: Secondary | ICD-10-CM | POA: Insufficient documentation

## 2018-12-17 DIAGNOSIS — F17200 Nicotine dependence, unspecified, uncomplicated: Secondary | ICD-10-CM | POA: Insufficient documentation

## 2018-12-17 DIAGNOSIS — F319 Bipolar disorder, unspecified: Secondary | ICD-10-CM | POA: Diagnosis not present

## 2018-12-17 DIAGNOSIS — R4689 Other symptoms and signs involving appearance and behavior: Secondary | ICD-10-CM | POA: Diagnosis present

## 2018-12-17 DIAGNOSIS — Z7982 Long term (current) use of aspirin: Secondary | ICD-10-CM | POA: Insufficient documentation

## 2018-12-17 DIAGNOSIS — Z79899 Other long term (current) drug therapy: Secondary | ICD-10-CM | POA: Diagnosis not present

## 2018-12-17 DIAGNOSIS — E872 Acidosis: Secondary | ICD-10-CM | POA: Insufficient documentation

## 2018-12-17 DIAGNOSIS — F3132 Bipolar disorder, current episode depressed, moderate: Secondary | ICD-10-CM

## 2018-12-17 DIAGNOSIS — G40909 Epilepsy, unspecified, not intractable, without status epilepticus: Secondary | ICD-10-CM | POA: Diagnosis not present

## 2018-12-17 DIAGNOSIS — T1491XA Suicide attempt, initial encounter: Secondary | ICD-10-CM | POA: Diagnosis present

## 2018-12-17 DIAGNOSIS — R4589 Other symptoms and signs involving emotional state: Secondary | ICD-10-CM

## 2018-12-17 DIAGNOSIS — R45851 Suicidal ideations: Secondary | ICD-10-CM | POA: Diagnosis not present

## 2018-12-17 DIAGNOSIS — E1110 Type 2 diabetes mellitus with ketoacidosis without coma: Secondary | ICD-10-CM | POA: Insufficient documentation

## 2018-12-17 DIAGNOSIS — R739 Hyperglycemia, unspecified: Secondary | ICD-10-CM

## 2018-12-17 DIAGNOSIS — E785 Hyperlipidemia, unspecified: Secondary | ICD-10-CM | POA: Diagnosis not present

## 2018-12-17 DIAGNOSIS — E8729 Other acidosis: Secondary | ICD-10-CM

## 2018-12-17 DIAGNOSIS — I252 Old myocardial infarction: Secondary | ICD-10-CM | POA: Diagnosis not present

## 2018-12-17 DIAGNOSIS — Z79891 Long term (current) use of opiate analgesic: Secondary | ICD-10-CM | POA: Insufficient documentation

## 2018-12-17 LAB — RAPID URINE DRUG SCREEN, HOSP PERFORMED
Amphetamines: NOT DETECTED
Barbiturates: NOT DETECTED
Benzodiazepines: NOT DETECTED
Cocaine: NOT DETECTED
Opiates: NOT DETECTED
Tetrahydrocannabinol: POSITIVE — AB

## 2018-12-17 LAB — BASIC METABOLIC PANEL
Anion gap: 21 — ABNORMAL HIGH (ref 5–15)
BUN: 11 mg/dL (ref 6–20)
CO2: 13 mmol/L — ABNORMAL LOW (ref 22–32)
Calcium: 9.2 mg/dL (ref 8.9–10.3)
Chloride: 104 mmol/L (ref 98–111)
Creatinine, Ser: 1.1 mg/dL (ref 0.61–1.24)
GFR calc Af Amer: >60 mL/min (ref >60)
GFR calc non Af Amer: >60 mL/min (ref >60)
Glucose, Bld: 508 mg/dL (ref 70–99)
Potassium: 3.4 mmol/L — ABNORMAL LOW (ref 3.5–5.1)
Sodium: 138 mmol/L (ref 135–145)

## 2018-12-17 LAB — CBC
HCT: 50.2 % (ref 39.0–52.0)
Hemoglobin: 17.6 g/dL — ABNORMAL HIGH (ref 13.0–17.0)
MCH: 32.7 pg (ref 26.0–34.0)
MCHC: 35.1 g/dL (ref 30.0–36.0)
MCV: 93.3 fL (ref 80.0–100.0)
Platelets: 292 10*3/uL (ref 150–400)
RBC: 5.38 MIL/uL (ref 4.22–5.81)
RDW: 13 % (ref 11.5–15.5)
WBC: 18.4 10*3/uL — ABNORMAL HIGH (ref 4.0–10.5)
nRBC: 0 % (ref 0.0–0.2)

## 2018-12-17 LAB — TROPONIN I (HIGH SENSITIVITY)
Troponin I (High Sensitivity): 5 ng/L (ref <18)
Troponin I (High Sensitivity): 7 ng/L (ref <18)

## 2018-12-17 LAB — CBG MONITORING, ED: Glucose-Capillary: 397 mg/dL — ABNORMAL HIGH (ref 70–99)

## 2018-12-17 LAB — SARS CORONAVIRUS 2 BY RT PCR (HOSPITAL ORDER, PERFORMED IN ~~LOC~~ HOSPITAL LAB): SARS Coronavirus 2: NEGATIVE

## 2018-12-17 LAB — ETHANOL: Alcohol, Ethyl (B): 119 mg/dL — ABNORMAL HIGH (ref <10)

## 2018-12-17 MED ORDER — ACETAMINOPHEN 325 MG PO TABS: 650.0000 mg | ORAL_TABLET | ORAL | Status: DC | PRN

## 2018-12-17 MED ORDER — NICOTINE 21 MG/24HR TD PT24
21.0000 mg | MEDICATED_PATCH | Freq: Every day | TRANSDERMAL | Status: DC
  Administered 2018-12-18: 21 mg via TRANSDERMAL
  Filled 2018-12-17: qty 1

## 2018-12-17 MED ORDER — ASPIRIN EC 81 MG PO TBEC
81.0000 mg | DELAYED_RELEASE_TABLET | Freq: Every day | ORAL | Status: DC
  Administered 2018-12-18: 81 mg via ORAL
  Filled 2018-12-17: qty 1

## 2018-12-17 MED ORDER — SODIUM CHLORIDE 0.9 % IV BOLUS
1000.0000 mL | Freq: Once | INTRAVENOUS | Status: AC
  Administered 2018-12-17: 1000 mL via INTRAVENOUS

## 2018-12-17 MED ORDER — LISINOPRIL 5 MG PO TABS
5.0000 mg | ORAL_TABLET | Freq: Every day | ORAL | Status: DC
  Administered 2018-12-18: 5 mg via ORAL
  Filled 2018-12-17: qty 1

## 2018-12-17 MED ORDER — LAMOTRIGINE 150 MG PO TABS
400.0000 mg | ORAL_TABLET | Freq: Every day | ORAL | Status: DC
  Administered 2018-12-17: 400 mg via ORAL
  Filled 2018-12-17: qty 1

## 2018-12-17 MED ORDER — ALUM & MAG HYDROXIDE-SIMETH 200-200-20 MG/5ML PO SUSP: 30.0000 mL | Freq: Four times a day (QID) | ORAL | Status: DC | PRN

## 2018-12-17 MED ORDER — HYDROMORPHONE HCL 1 MG/ML IJ SOLN
1.0000 mg | Freq: Once | INTRAMUSCULAR | Status: AC
  Administered 2018-12-17: 1 mg via INTRAVENOUS
  Filled 2018-12-17: qty 1

## 2018-12-17 MED ORDER — SODIUM CHLORIDE 0.9% FLUSH: 3.0000 mL | Freq: Once | INTRAVENOUS | Status: DC

## 2018-12-17 MED ORDER — METFORMIN HCL 500 MG PO TABS: 1000.0000 mg | ORAL_TABLET | Freq: Every day | ORAL | Status: DC

## 2018-12-17 MED ORDER — INSULIN REGULAR(HUMAN) IN NACL 100-0.9 UT/100ML-% IV SOLN: INTRAVENOUS | Status: DC

## 2018-12-17 MED ORDER — ATORVASTATIN CALCIUM 40 MG PO TABS
40.0000 mg | ORAL_TABLET | Freq: Every day | ORAL | Status: DC
  Administered 2018-12-18: 40 mg via ORAL
  Filled 2018-12-17: qty 1

## 2018-12-17 MED ORDER — OXYCODONE-ACETAMINOPHEN 7.5-325 MG PO TABS
1.0000 | ORAL_TABLET | Freq: Four times a day (QID) | ORAL | Status: DC | PRN
  Administered 2018-12-18 (×2): 1 via ORAL
  Filled 2018-12-17 (×2): qty 1

## 2018-12-17 MED ORDER — TICAGRELOR 90 MG PO TABS
90.0000 mg | ORAL_TABLET | Freq: Two times a day (BID) | ORAL | Status: DC
  Administered 2018-12-17 – 2018-12-18 (×2): 90 mg via ORAL
  Filled 2018-12-17 (×2): qty 1

## 2018-12-17 MED ORDER — OXYCODONE HCL ER 10 MG PO T12A
20.0000 mg | EXTENDED_RELEASE_TABLET | Freq: Two times a day (BID) | ORAL | Status: DC
  Administered 2018-12-17 – 2018-12-18 (×2): 20 mg via ORAL
  Filled 2018-12-17 (×2): qty 2

## 2018-12-17 MED ORDER — LORAZEPAM 2 MG/ML IJ SOLN
2.0000 mg | Freq: Once | INTRAMUSCULAR | Status: AC
  Administered 2018-12-17: 2 mg via INTRAVENOUS
  Filled 2018-12-17: qty 1

## 2018-12-17 MED ORDER — LURASIDONE HCL 20 MG PO TABS
60.0000 mg | ORAL_TABLET | Freq: Every day | ORAL | Status: DC
  Administered 2018-12-17: 60 mg via ORAL
  Filled 2018-12-17 (×2): qty 3

## 2018-12-17 MED ORDER — HALOPERIDOL 5 MG PO TABS
5.0000 mg | ORAL_TABLET | Freq: Once | ORAL | Status: AC
  Administered 2018-12-17: 5 mg via ORAL
  Filled 2018-12-17: qty 1

## 2018-12-17 MED ORDER — INSULIN ASPART 100 UNIT/ML ~~LOC~~ SOLN
8.0000 [IU] | Freq: Once | SUBCUTANEOUS | Status: AC
  Administered 2018-12-17: 8 [IU] via SUBCUTANEOUS

## 2018-12-17 MED ORDER — ZOLPIDEM TARTRATE 5 MG PO TABS: 5.0000 mg | ORAL_TABLET | Freq: Every evening | ORAL | Status: DC | PRN

## 2018-12-17 MED ORDER — POTASSIUM CHLORIDE 10 MEQ/100ML IV SOLN: 10.0000 meq | INTRAVENOUS | Status: DC

## 2018-12-17 MED ORDER — METOPROLOL TARTRATE 25 MG PO TABS
25.0000 mg | ORAL_TABLET | Freq: Two times a day (BID) | ORAL | Status: DC
  Administered 2018-12-17 – 2018-12-18 (×2): 25 mg via ORAL
  Filled 2018-12-17 (×2): qty 1

## 2018-12-17 NOTE — ED Provider Notes (Signed)
[facility] EMERGENCY DEPARTMENT Provider Note   CSN: [id number] Arrival date & time: 12/17/18  1937     History   Chief Complaint Chief Complaint  Patient presents with   Aggressive Behavior    HPI Kenneth Jones is a 48 y.o. male.  Patient has a history of coronary disease and chronic pain.  He was just in the hospital for a STEMI and received a stent.  He said he is under a ton of stress with his recent STEMI, father passed away, and his wife just asked him for divorce.  He says he snapped and was found by the police tonight pointing a gun and himself threatening to kill himself.  He says he hears voices which is a longstanding problem but they are telling him to hurt himself now.     The history is provided by the patient.  Chest Pain Pain location:  Substernal area Pain quality: aching   Pain severity:  Severe Onset quality:  Unable to specify Timing:  Constant Progression:  Worsening Chronicity:  Chronic Relieved by:  Nothing Worsened by:  Nothing Ineffective treatments:  None tried Associated symptoms: shortness of breath   Associated symptoms: no abdominal pain, no cough, no diaphoresis, no fever, no headache, no nausea and no vomiting   Mental Health Problem Presenting symptoms: hallucinations, suicidal thoughts, suicidal threats and suicide attempt   Patient accompanied by:  Law enforcement Degree of incapacity (severity):  Unable to specify Onset quality:  Gradual Context: stressful life event   Associated symptoms: chest pain   Associated symptoms: no abdominal pain and no headaches     Past Medical History:  Diagnosis Date   Diabetes mellitus without complication (HCC)    MI (myocardial infarction) (HCC)    Seizures (HCC)     Patient Active Problem List   Diagnosis Date Noted   Diabetes mellitus type 2, uncontrolled, with complications (HCC) 11/22/2018   HLD (hyperlipidemia) 11/22/2018   CAD (coronary artery disease)  11/21/2018   Type 2 diabetes mellitus with hyperlipidemia (HCC) 11/21/2018   Chronic pain 11/21/2018   Obesity (BMI 30-39.9) 11/21/2018   Tobacco abuse 11/21/2018   Unstable angina (HCC) 11/21/2018   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    Past Surgical History:  Procedure Laterality Date   CARDIAC SURGERY     CORONARY STENT INTERVENTION N/A 11/23/2018   Procedure: CORONARY STENT INTERVENTION;  Surgeon: Tonny Bollman, MD;  Location: [facility] INVASIVE CV LAB;  Service: Cardiovascular;  Laterality: N/A;   LEFT HEART CATH AND CORS/GRAFTS ANGIOGRAPHY N/A 11/23/2018   Procedure: LEFT HEART CATH AND CORS/GRAFTS ANGIOGRAPHY;  Surgeon: Tonny Bollman, MD;  Location: [facility] INVASIVE CV LAB;  Service: Cardiovascular;  Laterality: N/A;        Home Medications    Prior to Admission medications   Medication Sig Start Date End Date Taking? Authorizing Provider  acetaminophen (TYLENOL) 325 MG tablet Take 650 mg by mouth every 6 (six) hours as needed for moderate pain.     [provider]  albuterol (PROVENTIL HFA;VENTOLIN HFA) 108 (90 Base) MCG/ACT inhaler Inhale 2 puffs into the lungs every 6 (six) hours as needed for wheezing or shortness of breath. 04/08/18   Zachery Dauer, NP  albuterol (PROVENTIL) (5 MG/ML) 0.5% nebulizer solution Take 0.5 mLs (2.5 mg total) by nebulization every 6 (six) hours as needed for wheezing or shortness of breath. 04/12/18   Tilden Fossa, MD  aspirin EC 81 MG tablet Take 81 mg by mouth daily.  [provider]  clonazePAM (KLONOPIN) 1 MG tablet Take 1 mg by mouth once as needed.    [provider]  dextromethorphan-guaiFENesin (MUCINEX DM) 30-600 MG 12hr tablet Take 1 tablet by mouth 2 (two) times daily as needed for cough.    [provider]  diclofenac sodium (VOLTAREN) 1 % GEL Apply 2 g topically 4 (four) times daily as needed (pain).     [provider]  empagliflozin (JARDIANCE) 10 MG TABS tablet Take 10 mg by mouth daily.     [provider]  ibuprofen (ADVIL,MOTRIN) 200 MG tablet Take 400 mg by mouth daily as needed for moderate pain.     [provider]  lamoTRIgine (LAMICTAL) 200 MG tablet Take 400 mg by mouth at bedtime.     [provider]  lisinopril (PRINIVIL,ZESTRIL) 5 MG tablet Take 5 mg by mouth daily.    [provider]  Lurasidone HCl (LATUDA) 60 MG TABS Take 1 tablet by mouth at bedtime.    [provider]  metFORMIN (GLUCOPHAGE) 1000 MG tablet Take 1,000 mg by mouth daily.    [provider]  metoprolol tartrate (LOPRESSOR) 25 MG tablet Take 1 tablet (25 mg total) by mouth 2 (two) times daily. 11/23/18   Dunn, Tacey Ruizayna N, PA-C  Multiple Vitamin (MULTIVITAMIN WITH MINERALS) TABS tablet Take 1 tablet by mouth daily.    [provider]  nitroGLYCERIN (NITROSTAT) 0.4 MG SL tablet Place 0.4 mg under the tongue as needed. 02/24/12   [provider]  oxyCODONE (OXYCONTIN) 20 mg 12 hr tablet Take 20 mg by mouth every 12 (twelve) hours.    [provider]  oxyCODONE-acetaminophen (PERCOCET) 7.5-325 MG tablet Take 1 tablet by mouth every 6 (six) hours as needed for severe pain.     [provider]  pseudoephedrine (SUDAFED) 120 MG 12 hr tablet Take 120 mg by mouth daily as needed for congestion.    [provider]  simvastatin (ZOCOR) 80 MG tablet Take 80 mg by mouth daily. 12/12/14   [provider]  tamsulosin (FLOMAX) 0.4 MG CAPS capsule Take 0.4 mg by mouth daily.    [provider]  ticagrelor (BRILINTA) 90 MG TABS tablet Take 1 tablet (90 mg total) by mouth 2 (two) times daily. 11/24/18   Laurann Montanaunn, Dayna N, PA-C    Family History History reviewed. No pertinent family history.  Social History Social History   Tobacco Use   Smoking status: Current Every Day Smoker   Smokeless tobacco: Never Used  Substance Use Topics   Alcohol use: Not Currently   Drug use: Not Currently     Allergies     Morphine and related   Review of Systems Review of Systems  Constitutional: Negative for diaphoresis and fever.  HENT: Negative for sore throat.   Eyes: Negative for visual disturbance.  Respiratory: Positive for shortness of breath. Negative for cough.   Cardiovascular: Positive for chest pain.  Gastrointestinal: Negative for abdominal pain, nausea and vomiting.  Genitourinary: Negative for dysuria.  Musculoskeletal: Negative for neck pain.  Skin: Negative for rash.  Neurological: Negative for headaches.  Psychiatric/Behavioral: Positive for hallucinations and suicidal ideas.     Physical Exam Updated Vital Signs BP (!) 138/92 (BP Location: Right Arm)    Pulse (!) 130    Temp 99 F (37.2 C) (Oral)    SpO2 96%   Physical Exam Vitals signs and nursing note reviewed.  Constitutional:      Appearance: He is  well-developed.  HENT:     Head: Normocephalic and atraumatic.  Eyes:     Conjunctiva/sclera: Conjunctivae normal.  Neck:     Musculoskeletal: Neck supple.  Cardiovascular:     Rate and Rhythm: Regular rhythm. Tachycardia present.     Heart sounds: No murmur.  Pulmonary:     Effort: Pulmonary effort is normal. No respiratory distress.     Breath sounds: Normal breath sounds.  Abdominal:     Palpations: Abdomen is soft.     Tenderness: There is no abdominal tenderness.  Musculoskeletal: Normal range of motion.     Right lower leg: No edema.     Left lower leg: No edema.  Skin:    General: Skin is warm and dry.     Capillary Refill: Capillary refill takes less than 2 seconds.  Neurological:     General: No focal deficit present.     Mental Status: He is alert and oriented to person, place, and time.  Psychiatric:        Mood and Affect: Mood is anxious.        Behavior: Behavior is agitated.        Thought Content: Thought content includes suicidal ideation. Thought content includes suicidal plan.      ED Treatments / Results  Labs (all labs ordered are  listed, but only abnormal results are displayed) Labs Reviewed  BASIC METABOLIC PANEL - Abnormal; Notable for the following components:      Result Value   Potassium 3.4 (*)    CO2 13 (*)    Glucose, Bld 508 (*)    Anion gap 21 (*)    All other components within normal limits  CBC - Abnormal; Notable for the following components:   WBC 18.4 (*)    Hemoglobin 17.6 (*)    All other components within normal limits  ETHANOL - Abnormal; Notable for the following components:   Alcohol, Ethyl (B) 119 (*)    All other components within normal limits  RAPID URINE DRUG SCREEN, HOSP PERFORMED - Abnormal; Notable for the following components:   Tetrahydrocannabinol POSITIVE (*)    All other components within normal limits  BASIC METABOLIC PANEL - Abnormal; Notable for the following components:   CO2 19 (*)    Glucose, Bld 271 (*)    Calcium 8.8 (*)    All other components within normal limits  LACTIC ACID, PLASMA - Abnormal; Notable for the following components:   Lactic Acid, Venous 3.1 (*)    All other components within normal limits  BASIC METABOLIC PANEL - Abnormal; Notable for the following components:   CO2 17 (*)    Glucose, Bld 228 (*)    Calcium 8.5 (*)    Anion gap 16 (*)    All other components within normal limits  BASIC METABOLIC PANEL - Abnormal; Notable for the following components:   Glucose, Bld 171 (*)    Calcium 8.6 (*)    All other components within normal limits  GLUCOSE, CAPILLARY - Abnormal; Notable for the following components:   Glucose-Capillary 186 (*)    All other components within normal limits  CBG MONITORING, ED - Abnormal; Notable for the following components:   Glucose-Capillary 397 (*)    All other components within normal limits  POCT I-STAT EG7 - Abnormal; Notable for the following components:   pCO2, Ven 30.2 (*)    Bicarbonate 18.5 (*)    TCO2 19 (*)    Acid-base deficit 5.0 (*)  Calcium, Ion 1.05 (*)    All other components within normal  limits  CBG MONITORING, ED - Abnormal; Notable for the following components:   Glucose-Capillary 239 (*)    All other components within normal limits  POCT I-STAT EG7 - Abnormal; Notable for the following components:   pH, Ven 7.446 (*)    pCO2, Ven 31.7 (*)    pO2, Ven 95.0 (*)    Calcium, Ion 1.10 (*)    All other components within normal limits  SARS CORONAVIRUS 2 (HOSPITAL ORDER, PERFORMED IN  HOSPITAL LAB)  LACTIC ACID, PLASMA  BETA-HYDROXYBUTYRIC ACID  LACTIC ACID, PLASMA  URINALYSIS, ROUTINE W REFLEX MICROSCOPIC  I-STAT VENOUS BLOOD GAS, ED  I-STAT VENOUS BLOOD GAS, ED  TROPONIN I (HIGH SENSITIVITY)  TROPONIN I (HIGH SENSITIVITY)  TROPONIN I (HIGH SENSITIVITY)  TROPONIN I (HIGH SENSITIVITY)    EKG EKG Interpretation  Date/Time:  Thursday December 17 2018 20:02:15 EDT Ventricular Rate:  139 PR Interval:  166 QRS Duration: 62 QT Interval:  296 QTC Calculation: 450 R Axis:   100 Text Interpretation:  Undetermined rhythm Rightward axis Septal infarct , age undetermined Abnormal ECG Confirmed by Meridee ScoreButler, Gwendolyn Nishi [phone]([id number]) on 12/17/2018 8:27:25 PM   Radiology Dg Chest 2 View  Result Date: 12/17/2018 CLINICAL DATA:  Chest pain EXAM: CHEST - 2 VIEW COMPARISON:  November 21, 2018 FINDINGS: Patient is status post prior median sternotomy. The heart size is mildly enlarged. There is no pneumothorax. No large pleural effusion. No acute osseous abnormality. Degenerative changes are noted of the spine and glenohumeral joints. IMPRESSION: No active cardiopulmonary disease. Electronically Signed   By: Katherine Mantlehristopher  Green M.D.   On: 12/17/2018 20:55    Procedures Procedures (including critical care time)  Medications Ordered in ED Medications  sodium chloride flush (NS) 0.9 % injection 3 mL (3 mLs Intravenous Not Given 12/17/18 2102)  acetaminophen (TYLENOL) tablet 650 mg (has no administration in time range)  alum & mag hydroxide-simeth (MAALOX/MYLANTA) 200-200-20 MG/5ML suspension  30 mL (has no administration in time range)  nicotine (NICODERM CQ - dosed in mg/24 hours) patch 21 mg (21 mg Transdermal Patch Applied 12/18/18 0854)  zolpidem (AMBIEN) tablet 5 mg (has no administration in time range)  aspirin EC tablet 81 mg (81 mg Oral Given 12/18/18 0854)  lamoTRIgine (LAMICTAL) tablet 400 mg (400 mg Oral Given 12/17/18 2324)  lisinopril (ZESTRIL) tablet 5 mg (5 mg Oral Given 12/18/18 0853)  lurasidone (LATUDA) tablet 60 mg (60 mg Oral Given 12/17/18 2324)  metoprolol tartrate (LOPRESSOR) tablet 25 mg (25 mg Oral Given 12/18/18 0854)  ticagrelor (BRILINTA) tablet 90 mg (90 mg Oral Given 12/18/18 0854)  atorvastatin (LIPITOR) tablet 40 mg (has no administration in time range)  oxyCODONE (OXYCONTIN) 12 hr tablet 20 mg (20 mg Oral Given 12/18/18 0856)  oxyCODONE-acetaminophen (PERCOCET) 7.5-325 MG per tablet 1 tablet (1 tablet Oral Given 12/18/18 0712)  LORazepam (ATIVAN) tablet 1 mg (has no administration in time range)    Or  LORazepam (ATIVAN) injection 1 mg (has no administration in time range)  thiamine (VITAMIN B-1) tablet 100 mg (100 mg Oral Given 12/18/18 0853)    Or  thiamine (B-1) injection 100 mg ( Intravenous See Alternative 12/18/18 0853)  folic acid (FOLVITE) tablet 1 mg (1 mg Oral Given 12/18/18 0854)  multivitamin with minerals tablet 1 tablet (1 tablet Oral Given 12/18/18 0853)  LORazepam (ATIVAN) injection 0-4 mg (0 mg Intravenous Not Given 12/18/18 0608)    Followed by  LORazepam (ATIVAN)  injection 0-4 mg (has no administration in time range)  insulin aspart (novoLOG) injection 0-15 Units (3 Units Subcutaneous Given 12/18/18 0711)  insulin glargine (LANTUS) injection 20 Units (has no administration in time range)  0.9 %  sodium chloride infusion ( Intravenous New Bag/Given 12/18/18 0605)  ondansetron (ZOFRAN) tablet 4 mg (has no administration in time range)    Or  ondansetron (ZOFRAN) injection 4 mg (has no administration in time range)  enoxaparin (LOVENOX)  injection 40 mg (has no administration in time range)  HYDROmorphone (DILAUDID) injection 1 mg (1 mg Intravenous Given 12/17/18 2026)  haloperidol (HALDOL) tablet 5 mg (5 mg Oral Given 12/17/18 2105)  insulin aspart (novoLOG) injection 8 Units (8 Units Subcutaneous Given 12/17/18 2118)  LORazepam (ATIVAN) injection 2 mg (2 mg Intravenous Given 12/17/18 2257)  sodium chloride 0.9 % bolus 1,000 mL (0 mLs Intravenous Stopped 12/18/18 0011)  potassium chloride SA (K-DUR) CR tablet 40 mEq (40 mEq Oral Given 12/18/18 0158)     Initial Impression / Assessment and Plan / ED Course  I have reviewed the triage vital signs and the nursing notes.  Pertinent labs & imaging results that were available during my care of the patient were reviewed by me and considered in my medical decision making (see chart for details).  Clinical Course as of Dec 17 940  Thu Dec 17, 2018  [id number] 48 year old male with known coronary disease CABG recent stents for STEMI here with thoughts of harming himself.  He is quite tachycardic and agitated.  He has a history of chronic pain.  We are giving him some narcotics for pain and sending of some screening labs.  Will place him under the an IVC.   [MB]  2115 I spoke with the patient's wife.  She said he was very upset about his impending separation and he began talking about killing himself in front of her so she could see his brains.  He then threatened to kill her first and then kill himself.  He went to get a gun while she left the house and called 911.  Please say he was holding a gun and asking for them to shoot him while he pointed the gun at his head threatening to kill himself.  Who placed him under an IVC.  His initial troponin is 5.  He will need a repeat troponin.  This does not appear to be showing up in epic that was done.  His blood sugar is also elevated at 508.  Patient is asking for pain medicine for his chest pain and some medicine to calm him down asking for Haldol  specifically.  This is been ordered.   [MB]  2247 Patient's delta troponin from 5-7 which is not a significant change.  His blood pressure and heart rate have improved since arrival.  I have filled out an IVC.  His blood sugar is elevated and he received some insulin subcu. he still has a gap so have ordered some IV fluids.  He will need a repeat chemistry and if his gap has normalized he can be cleared for further psychiatric evaluation.  If his gap not closing he potentially needs to be admitted for medical management of his acidosis.   [MB]    Clinical Course User Index [MB] Terrilee Files, MD   TRASE BUNDA was evaluated in Emergency Department on 12/17/2018 for the symptoms described in the history of present illness. He was evaluated in the context of the global COVID-19 pandemic,  which necessitated consideration that the patient might be at risk for infection with the SARS-CoV-2 virus that causes COVID-19. Institutional protocols and algorithms that pertain to the evaluation of patients at risk for COVID-19 are in a state of rapid change based on information released by regulatory bodies including the CDC and federal and state organizations. These policies and algorithms were followed during the patient's care in the ED.       Final Clinical Impressions(s) / ED Diagnoses   Final diagnoses:  Homicidal behavior  Suicidal behavior without attempted self-injury  Chronic chest pain with high risk for CAD  Hyperglycemia  Increased anion gap metabolic acidosis    ED Discharge Orders    None       Terrilee FilesButler, Kingsten Enfield C, MD 12/18/18 0945

## 2018-12-17 NOTE — ED Notes (Signed)
Pt's mother Myrtie Soman updated per pt request. #s [phone] or [phone].

## 2018-12-17 NOTE — Progress Notes (Signed)
TTS was able to get in touch with Rise Patience, RN who states she will have the pt ready to be seen in 10 mins.  Lind Covert, MSW, LCSW Therapeutic Triage Specialist  [phone]

## 2018-12-17 NOTE — Progress Notes (Signed)
TTS consult ordered. TTS calling cart to complete assessment. No answer. Spoke with Elberta who states she will inform ED staff to place cart in the pt's room.  Lind Covert, MSW, LCSW Therapeutic Triage Specialist  [phone]

## 2018-12-17 NOTE — ED Notes (Signed)
Pt stated, "I wish I had pulled the trigger". Pt crying, and agitated at this time.

## 2018-12-17 NOTE — Progress Notes (Signed)
TTS continues calling cart, no answer. Will keep trying.  Lind Covert, MSW, LCSW Therapeutic Triage Specialist  [phone]

## 2018-12-17 NOTE — ED Notes (Signed)
Family at bedside. 

## 2018-12-17 NOTE — ED Notes (Signed)
Per Dr. Melina Copa, hold insulin drip until repeat BMP.

## 2018-12-17 NOTE — ED Triage Notes (Signed)
Patient brought in by [employer]. He reports he has a lot going on in his live, recent stemi, father passed away and his wife left him. He reports today he "snapped".

## 2018-12-17 NOTE — Progress Notes (Signed)
TTS calling cart, no answer. 

## 2018-12-17 NOTE — ED Notes (Signed)
Pt resting comfortably at this time. [employer] still at bedside.

## 2018-12-18 DIAGNOSIS — E8729 Other acidosis: Secondary | ICD-10-CM | POA: Diagnosis present

## 2018-12-18 DIAGNOSIS — R569 Unspecified convulsions: Secondary | ICD-10-CM

## 2018-12-18 DIAGNOSIS — E872 Acidosis: Secondary | ICD-10-CM

## 2018-12-18 DIAGNOSIS — E785 Hyperlipidemia, unspecified: Secondary | ICD-10-CM

## 2018-12-18 DIAGNOSIS — F3132 Bipolar disorder, current episode depressed, moderate: Secondary | ICD-10-CM

## 2018-12-18 DIAGNOSIS — I25118 Atherosclerotic heart disease of native coronary artery with other forms of angina pectoris: Secondary | ICD-10-CM

## 2018-12-18 DIAGNOSIS — Z951 Presence of aortocoronary bypass graft: Secondary | ICD-10-CM

## 2018-12-18 DIAGNOSIS — R4689 Other symptoms and signs involving appearance and behavior: Secondary | ICD-10-CM

## 2018-12-18 DIAGNOSIS — T1491XA Suicide attempt, initial encounter: Secondary | ICD-10-CM

## 2018-12-18 DIAGNOSIS — G8922 Chronic post-thoracotomy pain: Secondary | ICD-10-CM

## 2018-12-18 DIAGNOSIS — R4589 Other symptoms and signs involving emotional state: Secondary | ICD-10-CM | POA: Insufficient documentation

## 2018-12-18 DIAGNOSIS — E1169 Type 2 diabetes mellitus with other specified complication: Secondary | ICD-10-CM

## 2018-12-18 LAB — BASIC METABOLIC PANEL
Anion gap: 13 (ref 5–15)
Anion gap: 16 — ABNORMAL HIGH (ref 5–15)
Anion gap: 8 (ref 5–15)
BUN: 8 mg/dL (ref 6–20)
BUN: 8 mg/dL (ref 6–20)
BUN: 9 mg/dL (ref 6–20)
CO2: 17 mmol/L — ABNORMAL LOW (ref 22–32)
CO2: 19 mmol/L — ABNORMAL LOW (ref 22–32)
CO2: 23 mmol/L (ref 22–32)
Calcium: 8.5 mg/dL — ABNORMAL LOW (ref 8.9–10.3)
Calcium: 8.6 mg/dL — ABNORMAL LOW (ref 8.9–10.3)
Calcium: 8.8 mg/dL — ABNORMAL LOW (ref 8.9–10.3)
Chloride: 106 mmol/L (ref 98–111)
Chloride: 109 mmol/L (ref 98–111)
Chloride: 110 mmol/L (ref 98–111)
Creatinine, Ser: 0.75 mg/dL (ref 0.61–1.24)
Creatinine, Ser: 0.83 mg/dL (ref 0.61–1.24)
Creatinine, Ser: 0.86 mg/dL (ref 0.61–1.24)
GFR calc Af Amer: >60 mL/min (ref >60)
GFR calc Af Amer: >60 mL/min (ref >60)
GFR calc Af Amer: >60 mL/min (ref >60)
GFR calc non Af Amer: >60 mL/min (ref >60)
GFR calc non Af Amer: >60 mL/min (ref >60)
GFR calc non Af Amer: >60 mL/min (ref >60)
Glucose, Bld: 171 mg/dL — ABNORMAL HIGH (ref 70–99)
Glucose, Bld: 228 mg/dL — ABNORMAL HIGH (ref 70–99)
Glucose, Bld: 271 mg/dL — ABNORMAL HIGH (ref 70–99)
Potassium: 3.9 mmol/L (ref 3.5–5.1)
Potassium: 4 mmol/L (ref 3.5–5.1)
Potassium: 4.3 mmol/L (ref 3.5–5.1)
Sodium: 139 mmol/L (ref 135–145)
Sodium: 141 mmol/L (ref 135–145)
Sodium: 141 mmol/L (ref 135–145)

## 2018-12-18 LAB — POCT I-STAT EG7
Acid-base deficit: 5 mmol/L — ABNORMAL HIGH (ref 0.0–2.0)
Acid-base deficit: 1 mmol/L (ref 0.0–2.0)
Bicarbonate: 18.5 mmol/L — ABNORMAL LOW (ref 20.0–28.0)
Bicarbonate: 21.8 mmol/L (ref 20.0–28.0)
Calcium, Ion: 1.05 mmol/L — ABNORMAL LOW (ref 1.15–1.40)
Calcium, Ion: 1.1 mmol/L — ABNORMAL LOW (ref 1.15–1.40)
HCT: 47 % (ref 39.0–52.0)
HCT: 42 % (ref 39.0–52.0)
Hemoglobin: 16 g/dL (ref 13.0–17.0)
Hemoglobin: 14.3 g/dL (ref 13.0–17.0)
O2 Saturation: 78 %
O2 Saturation: 98 %
Potassium: 3.5 mmol/L (ref 3.5–5.1)
Potassium: 3.8 mmol/L (ref 3.5–5.1)
Sodium: 140 mmol/L (ref 135–145)
Sodium: 140 mmol/L (ref 135–145)
TCO2: 19 mmol/L — ABNORMAL LOW (ref 22–32)
TCO2: 23 mmol/L (ref 22–32)
pCO2, Ven: 30.2 mmHg — ABNORMAL LOW (ref 44.0–60.0)
pCO2, Ven: 31.7 mmHg — ABNORMAL LOW (ref 44.0–60.0)
pH, Ven: 7.396 (ref 7.250–7.430)
pH, Ven: 7.446 — ABNORMAL HIGH (ref 7.250–7.430)
pO2, Ven: 42 mmHg (ref 32.0–45.0)
pO2, Ven: 95 mmHg — ABNORMAL HIGH (ref 32.0–45.0)

## 2018-12-18 LAB — GLUCOSE, CAPILLARY
Glucose-Capillary: 186 mg/dL — ABNORMAL HIGH (ref 70–99)
Glucose-Capillary: 184 mg/dL — ABNORMAL HIGH (ref 70–99)
Glucose-Capillary: 196 mg/dL — ABNORMAL HIGH (ref 70–99)

## 2018-12-18 LAB — TROPONIN I (HIGH SENSITIVITY)
Troponin I (High Sensitivity): 9 ng/L (ref <18)
Troponin I (High Sensitivity): 9 ng/L (ref <18)

## 2018-12-18 LAB — LACTIC ACID, PLASMA
Lactic Acid, Venous: 3.1 mmol/L (ref 0.5–1.9)
Lactic Acid, Venous: 1.7 mmol/L (ref 0.5–1.9)
Lactic Acid, Venous: 1.7 mmol/L (ref 0.5–1.9)

## 2018-12-18 LAB — CBG MONITORING, ED: Glucose-Capillary: 239 mg/dL — ABNORMAL HIGH (ref 70–99)

## 2018-12-18 LAB — BETA-HYDROXYBUTYRIC ACID: Beta-Hydroxybutyric Acid: 0.08 mmol/L (ref 0.05–0.27)

## 2018-12-18 MED ORDER — ENOXAPARIN SODIUM 40 MG/0.4ML ~~LOC~~ SOLN
40.0000 mg | SUBCUTANEOUS | Status: DC
  Administered 2018-12-18: 40 mg via SUBCUTANEOUS
  Filled 2018-12-18: qty 0.4

## 2018-12-18 MED ORDER — INSULIN REGULAR(HUMAN) IN NACL 100-0.9 UT/100ML-% IV SOLN: INTRAVENOUS | Status: DC

## 2018-12-18 MED ORDER — LORAZEPAM 2 MG/ML IJ SOLN: 0.0000 mg | Freq: Two times a day (BID) | INTRAMUSCULAR | Status: DC

## 2018-12-18 MED ORDER — LORAZEPAM 2 MG/ML IJ SOLN: 1.0000 mg | Freq: Four times a day (QID) | INTRAMUSCULAR | Status: DC | PRN

## 2018-12-18 MED ORDER — ONDANSETRON HCL 4 MG PO TABS: 4.0000 mg | ORAL_TABLET | Freq: Four times a day (QID) | ORAL | Status: DC | PRN

## 2018-12-18 MED ORDER — INSULIN ASPART 100 UNIT/ML ~~LOC~~ SOLN: 0.0000 [IU] | Freq: Three times a day (TID) | SUBCUTANEOUS | Status: DC

## 2018-12-18 MED ORDER — ONDANSETRON HCL 4 MG/2ML IJ SOLN: 4.0000 mg | Freq: Four times a day (QID) | INTRAMUSCULAR | Status: DC | PRN

## 2018-12-18 MED ORDER — INSULIN GLARGINE 100 UNIT/ML ~~LOC~~ SOLN: 20.0000 [IU] | Freq: Every day | SUBCUTANEOUS | Status: DC

## 2018-12-18 MED ORDER — LORAZEPAM 1 MG PO TABS: 1.0000 mg | ORAL_TABLET | Freq: Four times a day (QID) | ORAL | Status: DC | PRN

## 2018-12-18 MED ORDER — LORAZEPAM 2 MG/ML IJ SOLN: 0.0000 mg | Freq: Four times a day (QID) | INTRAMUSCULAR | Status: DC

## 2018-12-18 MED ORDER — VITAMIN B-1 100 MG PO TABS
100.0000 mg | ORAL_TABLET | Freq: Every day | ORAL | Status: DC
  Administered 2018-12-18: 100 mg via ORAL
  Filled 2018-12-18: qty 1

## 2018-12-18 MED ORDER — SODIUM CHLORIDE 0.9 % IV SOLN
INTRAVENOUS | Status: DC
  Administered 2018-12-18: 06:00:00 via INTRAVENOUS

## 2018-12-18 MED ORDER — POTASSIUM CHLORIDE CRYS ER 20 MEQ PO TBCR
40.0000 meq | EXTENDED_RELEASE_TABLET | Freq: Once | ORAL | Status: AC
  Administered 2018-12-18: 40 meq via ORAL
  Filled 2018-12-18: qty 2

## 2018-12-18 MED ORDER — INSULIN GLARGINE 100 UNIT/ML ~~LOC~~ SOLN
10.0000 [IU] | Freq: Every day | SUBCUTANEOUS | Status: DC
  Administered 2018-12-18: 10 [IU] via SUBCUTANEOUS
  Filled 2018-12-18: qty 0.1

## 2018-12-18 MED ORDER — POTASSIUM CHLORIDE 10 MEQ/100ML IV SOLN: 10.0000 meq | INTRAVENOUS | Status: DC

## 2018-12-18 MED ORDER — FOLIC ACID 1 MG PO TABS
1.0000 mg | ORAL_TABLET | Freq: Every day | ORAL | Status: DC
  Administered 2018-12-18: 1 mg via ORAL
  Filled 2018-12-18: qty 1

## 2018-12-18 MED ORDER — THIAMINE HCL 100 MG/ML IJ SOLN: 100.0000 mg | Freq: Every day | INTRAMUSCULAR | Status: DC

## 2018-12-18 MED ORDER — SODIUM CHLORIDE 0.9 % IV SOLN: INTRAVENOUS | Status: DC

## 2018-12-18 MED ORDER — SODIUM CHLORIDE 0.9 % IV BOLUS: 1000.0000 mL | Freq: Once | INTRAVENOUS | Status: DC

## 2018-12-18 MED ORDER — INSULIN ASPART 100 UNIT/ML ~~LOC~~ SOLN
0.0000 [IU] | Freq: Three times a day (TID) | SUBCUTANEOUS | Status: DC
  Administered 2018-12-18 (×3): 3 [IU] via SUBCUTANEOUS

## 2018-12-18 MED ORDER — DEXTROSE-NACL 5-0.45 % IV SOLN: INTRAVENOUS | Status: DC

## 2018-12-18 MED ORDER — ADULT MULTIVITAMIN W/MINERALS CH
1.0000 | ORAL_TABLET | Freq: Every day | ORAL | Status: DC
  Administered 2018-12-18: 1 via ORAL
  Filled 2018-12-18: qty 1

## 2018-12-18 NOTE — Discharge Summary (Addendum)
Physician Discharge Summary  Kenneth Jones EAV:[id number] DOB: 08/17/1970 DOA: 12/17/2018  PCP: Patient, No Pcp Per  Admit date: 12/17/2018 Discharge date: 12/18/2018  Admitted From: Home Disposition: Home   Recommendations for Outpatient Follow-up and new medication changes:  1. Follow up with Primary care 7 days post discharge. 2. Acidosis has resolved, will allow patient to resume metformin and empagliflozin in am.    Late entry: I spoke with Dr. Sharma Jones, consultant psychiatrist, she has evaluated Kenneth Jones and has concluded that he is safe to be discharged home, he will be picked up by his mother and [place] [facility] [place].   Home Health: no   Equipment/Devices: no    Discharge Condition: stable  CODE STATUS: full  Diet recommendation: heart healthy   Brief/Interim Summary: 48 year old male who presented with suicidal attempt.  He does have significant past medical history for type 2 diabetes mellitus, seizures, coronary artery disease status post bypass grafting, and chronic chest pain.  He was brought to the emergency department under involuntary commitment due to threatening to kill himself and his wife with a gun.  On his initial physical examination blood pressure 129/93, heart rate 92 -113, respiratory rate 21-23, oxygen saturation 97%, he had moist mucous membranes, his lungs were clear to auscultation bilaterally, heart S1-S2 present rhythmic, abdomen soft, no lower extremity edema.  Sodium 138, potassium 3.4, chloride 104, bicarb 13, glucose 508, BUN 11, creatinine 1.1, white count 18.4, hemoglobin 17.6, hematocrit 50.2, platelets 292.  SARS COVID-19 was negative.  His alcohol level was 119, tox screen positive for tetrahydrocannabinol.  His chest radiograph was negative for infiltrates.  EKG rate of 139 bpm, right axis deviation, normal intervals, sinus rhythm, no ST segment or T wave changes, poor R wave progression.  Patient was admitted to the  hospital working diagnosis of alcohol intoxication complicated by alcoholic/diabetic ketoacidosis.   1.  Ketoacidosis, combined alcoholic and diabetic Patient was admitted to the medical ward under observation, received supportive care with intravenous fluids, his hyperglycemia and anion gap improved after insulin subcutaneous administration.  He did not required IV insulin.  His discharge venous pH is 7.44, serum bicarbonate 23, anion gap of 8.  2.  Coronary disease status post bypass grafting.  No active chest pain no signs of ischemia continue aspirin and ticagrelor.  Medical therapy with beta blockade and ACE inhibitor.  3.  Seizure disorder.  Seems to be stable, continue Lamotrigine.   4.  Alcohol intoxication.  No signs of active withdrawal.  He did not required any benzodiazepines.  5.  Suicidal attempt.  Patient is medically stable to be transferred to psychiatric unit.    Discharge Diagnoses:  Principal Problem:   Suicide attempt [facility](HCC) Active Problems:   Hx of CABG   CAD (coronary artery disease)   Type 2 diabetes mellitus with hyperlipidemia (HCC)   Chronic pain   Seizures (HCC)   High anion gap metabolic acidosis    Discharge Instructions   Allergies as of 12/18/2018      Reactions   Morphine And Related Shortness Of Breath, Itching      Medication List    TAKE these medications   acetaminophen 325 MG tablet Commonly known as: TYLENOL Take 650 mg by mouth every 6 (six) hours as needed for moderate pain.   albuterol 108 (90 Base) MCG/ACT inhaler Commonly known as: VENTOLIN HFA Inhale 2 puffs into the lungs every 6 (six) hours as needed for wheezing or shortness of  breath.   albuterol (5 MG/ML) 0.5% nebulizer solution Commonly known as: PROVENTIL Take 0.5 mLs (2.5 mg total) by nebulization every 6 (six) hours as needed for wheezing or shortness of breath.   aspirin EC 81 MG tablet Take 81 mg by mouth daily.   clonazePAM 1 MG tablet Commonly known as:  KLONOPIN Take 1 mg by mouth once as needed.   dextromethorphan-guaiFENesin 30-600 MG 12hr tablet Commonly known as: MUCINEX DM Take 1 tablet by mouth 2 (two) times daily as needed for cough.   empagliflozin 10 MG Tabs tablet Commonly known as: JARDIANCE Take 10 mg by mouth daily.   ibuprofen 200 MG tablet Commonly known as: ADVIL Take 400 mg by mouth daily as needed for moderate pain.   lamoTRIgine 200 MG tablet Commonly known as: LAMICTAL Take 400 mg by mouth at bedtime.   Latuda 60 MG Tabs Generic drug: Lurasidone HCl Take 1 tablet by mouth at bedtime.   lisinopril 5 MG tablet Commonly known as: ZESTRIL Take 5 mg by mouth daily.   metFORMIN 1000 MG tablet Commonly known as: GLUCOPHAGE Take 1,000 mg by mouth daily.   metoprolol tartrate 25 MG tablet Commonly known as: LOPRESSOR Take 1 tablet (25 mg total) by mouth 2 (two) times daily.   multivitamin with minerals Tabs tablet Take 1 tablet by mouth daily.   nitroGLYCERIN 0.4 MG SL tablet Commonly known as: NITROSTAT Place 0.4 mg under the tongue as needed.   oxyCODONE 20 mg 12 hr tablet Commonly known as: OXYCONTIN Take 20 mg by mouth every 12 (twelve) hours.   oxyCODONE-acetaminophen 7.5-325 MG tablet Commonly known as: PERCOCET Take 1 tablet by mouth every 6 (six) hours as needed for severe pain.   pseudoephedrine 120 MG 12 hr tablet Commonly known as: SUDAFED Take 120 mg by mouth daily as needed for congestion.   simvastatin 80 MG tablet Commonly known as: ZOCOR Take 80 mg by mouth daily.   tamsulosin 0.4 MG Caps capsule Commonly known as: FLOMAX Take 0.4 mg by mouth daily.   ticagrelor 90 MG Tabs tablet Commonly known as: BRILINTA Take 1 tablet (90 mg total) by mouth 2 (two) times daily.   Voltaren 1 % Gel Generic drug: diclofenac sodium Apply 2 g topically 4 (four) times daily as needed (pain).       Allergies  Allergen Reactions  . Morphine And Related Shortness Of Breath and Itching     Consultations:  Psychiatry    Procedures/Studies: Dg Chest 2 View  Result Date: 12/17/2018 CLINICAL DATA:  Chest pain EXAM: CHEST - 2 VIEW COMPARISON:  November 21, 2018 FINDINGS: Patient is status post prior median sternotomy. The heart size is mildly enlarged. There is no pneumothorax. No large pleural effusion. No acute osseous abnormality. Degenerative changes are noted of the spine and glenohumeral joints. IMPRESSION: No active cardiopulmonary disease. Electronically Signed   By: Katherine Mantlehristopher  Green M.D.   On: 12/17/2018 20:55   Dg Chest Port 1 View  Result Date: 11/21/2018 CLINICAL DATA:  Pt c/o chest pains, SOB, sweating, nausea, and jaw pain after doing yard work about 30 minutes ago. Pt hx of CABG and cardiac stents. EXAM: PORTABLE CHEST 1 VIEW COMPARISON:  04/20/2018 FINDINGS: Prior CABG. A focal lordotic projection. The lungs appear clear. No cardiomegaly observed. The left costophrenic angle is clipped. Chronic mild widening of the right AC joint. IMPRESSION: 1. No acute findings. 2. Prior CABG. Electronically Signed   By: Gaylyn RongWalter  Liebkemann M.D.   On: 11/21/2018 12:35  Procedures:   Subjective: Patient is feeling well, no nausea or vomiting, no dyspnea. Chronic chest pain stable.   Discharge Exam: Vitals:   12/18/18 0600 12/18/18 0851  BP: 102/61 (!) 124/95  Pulse: 78   Resp:    Temp:    SpO2:     Vitals:   12/18/18 0446 12/18/18 0542 12/18/18 0600 12/18/18 0851  BP: (!) 94/57 (!) 80/46 102/61 (!) 124/95  Pulse: 84 83 78   Resp: 16 18    Temp: 98.2 F (36.8 C) 98.4 F (36.9 C)    TempSrc: Oral Oral    SpO2: 97% 97%      General: Not in pain or dyspnea  Neurology: Awake and alert, non focal  E ENT: no pallor, no icterus, oral mucosa moist Cardiovascular: No JVD. S1-S2 present, rhythmic, no gallops, rubs, or murmurs. No lower extremity edema. Pulmonary: positive breath sounds bilaterally, adequate air movement, no wheezing, rhonchi or  rales. Gastrointestinal. Abdomen with no organomegaly, non tender, no rebound or guarding Skin. No rashes Musculoskeletal: no joint deformities   The results of significant diagnostics from this hospitalization (including imaging, microbiology, ancillary and laboratory) are listed below for reference.     Microbiology: Recent Results (from the past 240 hour(s))  SARS Coronavirus 2 (CEPHEID - Performed in [facility] hospital lab), Hosp Order     Status: None   Collection Time: 12/17/18  9:20 PM   Specimen: Nasopharyngeal Swab  Result Value Ref Range Status   SARS Coronavirus 2 NEGATIVE NEGATIVE Final    Comment: (NOTE) If result is NEGATIVE SARS-CoV-2 target nucleic acids are NOT DETECTED. The SARS-CoV-2 RNA is generally detectable in upper and lower  respiratory specimens during the acute phase of infection. The lowest  concentration of SARS-CoV-2 viral copies this assay can detect is 250  copies / mL. A negative result does not preclude SARS-CoV-2 infection  and should not be used as the sole basis for treatment or other  patient management decisions.  A negative result may occur with  improper specimen collection / handling, submission of specimen other  than nasopharyngeal swab, presence of viral mutation(s) within the  areas targeted by this assay, and inadequate number of viral copies  (<250 copies / mL). A negative result must be combined with clinical  observations, patient history, and epidemiological information. If result is POSITIVE SARS-CoV-2 target nucleic acids are DETECTED. The SARS-CoV-2 RNA is generally detectable in upper and lower  respiratory specimens dur ing the acute phase of infection.  Positive  results are indicative of active infection with SARS-CoV-2.  Clinical  correlation with patient history and other diagnostic information is  necessary to determine patient infection status.  Positive results do  not rule out bacterial infection or co-infection  with other viruses. If result is PRESUMPTIVE POSTIVE SARS-CoV-2 nucleic acids MAY BE PRESENT.   A presumptive positive result was obtained on the submitted specimen  and confirmed on repeat testing.  While 2019 novel coronavirus  (SARS-CoV-2) nucleic acids may be present in the submitted sample  additional confirmatory testing may be necessary for epidemiological  and / or clinical management purposes  to differentiate between  SARS-CoV-2 and other Sarbecovirus currently known to infect humans.  If clinically indicated additional testing with an alternate test  methodology [phone]([id number]) is advised. The SARS-CoV-2 RNA is generally  detectable in upper and lower respiratory sp ecimens during the acute  phase of infection. The expected result is Negative. Fact Sheet for Patients:  BoilerBrush.com.cyhttps://www.fda.gov/media/136312/download Fact Sheet for  Healthcare Providers: https://pope.com/ This test is not yet approved or cleared by the [place] and has been authorized for detection and/or diagnosis of SARS-CoV-2 by FDA under an Emergency Use Authorization (EUA).  This EUA will remain in effect (meaning this test can be used) for the duration of the COVID-19 declaration under Section 564(b)(1) of the Act, 21 U.S.C. section 360bbb-3(b)(1), unless the authorization is terminated or revoked sooner. Performed at [facility], [address] [place], [place] [address]      Labs: BNP (last 3 results) No results for input(s): BNP in the last 8760 hours. Basic Metabolic Panel: Recent Labs  Lab 12/17/18 1953 12/18/18 0003 12/18/18 0121 12/18/18 0225 12/18/18 0456 12/18/18 0711  NA 138 141 140 139 140 141  K 3.4* 4.3 3.5 3.9 3.8 4.0  CL 104 109  --  106  --  110  CO2 13* 19*  --  17*  --  23  GLUCOSE 508* 271*  --  228*  --  171*  BUN 11 8  --  9  --  8  CREATININE 1.10 0.86  --  0.75  --  0.83  CALCIUM 9.2 8.8*  --  8.5*  --  8.6*   Liver Function  Tests: No results for input(s): AST, ALT, ALKPHOS, BILITOT, PROT, ALBUMIN in the last 168 hours. No results for input(s): LIPASE, AMYLASE in the last 168 hours. No results for input(s): AMMONIA in the last 168 hours. CBC: Recent Labs  Lab 12/17/18 1953 12/18/18 0121 12/18/18 0456  WBC 18.4*  --   --   HGB 17.6* 16.0 14.3  HCT 50.2 47.0 42.0  MCV 93.3  --   --   PLT 292  --   --    Cardiac Enzymes: No results for input(s): CKTOTAL, CKMB, CKMBINDEX, TROPONINI in the last 168 hours. BNP: Invalid input(s): POCBNP CBG: Recent Labs  Lab 12/17/18 2209 12/18/18 0134 12/18/18 0617 12/18/18 1130  GLUCAP 397* 239* 186* 184*   D-Dimer No results for input(s): DDIMER in the last 72 hours. Hgb A1c No results for input(s): HGBA1C in the last 72 hours. Lipid Profile No results for input(s): CHOL, HDL, LDLCALC, TRIG, CHOLHDL, LDLDIRECT in the last 72 hours. Thyroid function studies No results for input(s): TSH, T4TOTAL, T3FREE, THYROIDAB in the last 72 hours.  Invalid input(s): FREET3 Anemia work up No results for input(s): VITAMINB12, FOLATE, FERRITIN, TIBC, IRON, RETICCTPCT in the last 72 hours. Urinalysis    Component Value Date/Time   COLORURINE YELLOW 11/21/2018 1212   APPEARANCEUR CLEAR 11/21/2018 1212   LABSPEC 1.029 11/21/2018 1212   PHURINE 5.0 11/21/2018 1212   GLUCOSEU >=500 (A) 11/21/2018 1212   HGBUR NEGATIVE 11/21/2018 1212   BILIRUBINUR NEGATIVE 11/21/2018 1212   KETONESUR NEGATIVE 11/21/2018 1212   PROTEINUR NEGATIVE 11/21/2018 1212   NITRITE NEGATIVE 11/21/2018 1212   LEUKOCYTESUR NEGATIVE 11/21/2018 1212   Sepsis Labs Invalid input(s): PROCALCITONIN,  WBC,  LACTICIDVEN Microbiology Recent Results (from the past 240 hour(s))  SARS Coronavirus 2 (CEPHEID - Performed in [facility] hospital lab), Hosp Order     Status: None   Collection Time: 12/17/18  9:20 PM   Specimen: Nasopharyngeal Swab  Result Value Ref Range Status   SARS Coronavirus 2 NEGATIVE  NEGATIVE Final    Comment: (NOTE) If result is NEGATIVE SARS-CoV-2 target nucleic acids are NOT DETECTED. The SARS-CoV-2 RNA is generally detectable in upper and lower  respiratory specimens during the acute phase of infection. The lowest  concentration of SARS-CoV-2 viral copies this assay can detect is 250  copies / mL. A negative result does not preclude SARS-CoV-2 infection  and should not be used as the sole basis for treatment or other  patient management decisions.  A negative result may occur with  improper specimen collection / handling, submission of specimen other  than nasopharyngeal swab, presence of viral mutation(s) within the  areas targeted by this assay, and inadequate number of viral copies  (<250 copies / mL). A negative result must be combined with clinical  observations, patient history, and epidemiological information. If result is POSITIVE SARS-CoV-2 target nucleic acids are DETECTED. The SARS-CoV-2 RNA is generally detectable in upper and lower  respiratory specimens dur ing the acute phase of infection.  Positive  results are indicative of active infection with SARS-CoV-2.  Clinical  correlation with patient history and other diagnostic information is  necessary to determine patient infection status.  Positive results do  not rule out bacterial infection or co-infection with other viruses. If result is PRESUMPTIVE POSTIVE SARS-CoV-2 nucleic acids MAY BE PRESENT.   A presumptive positive result was obtained on the submitted specimen  and confirmed on repeat testing.  While 2019 novel coronavirus  (SARS-CoV-2) nucleic acids may be present in the submitted sample  additional confirmatory testing may be necessary for epidemiological  and / or clinical management purposes  to differentiate between  SARS-CoV-2 and other Sarbecovirus currently known to infect humans.  If clinically indicated additional testing with an alternate test  methodology [phone]) is  advised. The SARS-CoV-2 RNA is generally  detectable in upper and lower respiratory sp ecimens during the acute  phase of infection. The expected result is Negative. Fact Sheet for Patients:  StrictlyIdeas.no Fact Sheet for Healthcare Providers: BankingDealers.co.za This test is not yet approved or cleared by the [place] FDA and has been authorized for detection and/or diagnosis of SARS-CoV-2 by FDA under an Emergency Use Authorization (EUA).  This EUA will remain in effect (meaning this test can be used) for the duration of the COVID-19 declaration under Section 564(b)(1) of the Act, 21 U.S.C. section 360bbb-3(b)(1), unless the authorization is terminated or revoked sooner. Performed at [facility], [place] [address], [place], [place] [address]      Time coordinating discharge: 45 minutes  SIGNED:   Tawni Millers, MD  [facility] 12/18/2018, 12:05 PM

## 2018-12-18 NOTE — Progress Notes (Signed)
Lindon Romp, NP recommends inpt tx. TTS to seek placement once medically cleared. EDP Dr. Wyvonnia Dusky, MD has been advised.   TTS attempted to call the pt's nurse to advise of the disposition but did not receive an answer.  Lind Covert, MSW, LCSW Therapeutic Triage Specialist  [phone]

## 2018-12-18 NOTE — ED Notes (Signed)
If pt gets a chance, brother Rene Paci) would like for him to call at [phone]

## 2018-12-18 NOTE — ED Provider Notes (Signed)
Care assumed from Dr. Melina Copa.  Patient here with aggressive behavior and suicidal homicidal behavior.  Recent NSTEMI with stent placement.  Found to be hyperglycemic with anion gap of 21 and bicarb of 13.  He is pending repeat chemistry. IVC in place.  Repeat chemistry shows improvement of bicarb to 19. Anion gap is 14.  Discussed with Dr. Alcario Drought the hospitalist service who evaluated the patient.  He believes patient likely has a lactic acidosis and dehydration for intoxication been DKA.  He is never been insulin-dependent.  Troponins remain negative.  Patient reports compliance with his antiplatelet agents after catheterization 2 weeks ago.  The next chemistry shows increased anion gap with lower bicarbonate.  I discussed with Dr. Alcario Drought who will plan for observation admission.   Ezequiel Essex, MD 12/18/18 [phone]

## 2018-12-18 NOTE — Progress Notes (Signed)
Pt was discharged from 3E8@ 2138 per RN. Via wheelchair. Pt showing no signs of distress noted. IVC orders were d/c @ 1712 and acknowledged by day shift nurse. Nightshift attempted to contact MD to get IVC ordered d/c. Pt was taken down to ED entrance by RN, where ride was waiting for the pt.  Pt keys was given to him upon discharge by RN and confirmed by pt.  Pt keys were stored  in safe at the security office during hospital admission.  AVS completed upon pt discharge.

## 2018-12-18 NOTE — Progress Notes (Addendum)
Repeat BMP shows AG of 16 and bicarb of 17.  Possibly lactic acid and just dehydration from EtOH?  Dont know why he would develop DKA when he has insulin on board, decreasing BGL, and is a type 2 diabetic who hasnt been insulin dependent in ever in the past (has been on metformin and jardiance).  Will order lactic acid and BHB stat to try and differentiate.  IVF: NS at 125  Repeat BMP in 4H  Will just put in for observation at this point.

## 2018-12-18 NOTE — Progress Notes (Signed)
NCM was contacted that patient has been cleared by pysch,  NCM filled out form to recind the IVC with MD signature  And faxed to the [facility] at [phone].  Patient was dc on 7/10.

## 2018-12-18 NOTE — ED Notes (Signed)
Pt's belongings inventoried, placed in locker #3, valuables with security.

## 2018-12-18 NOTE — BH Assessment (Addendum)
Tele Assessment Note   Patient Name: Kenneth Jones Wynia MRN: [id number] Referring Physician: Terrilee FilesButler, Michael C, MD Location of Patient: [facility] Location of Provider: Behavioral Health TTS Department  Kenneth Jones Mccubbin is an 48 y.o. male who presents to the ED under IVC initiated by EDP. Per IVC, respondent "threatned to kill self and wife with gun. Multiple guns accessible. Asked police to kill him while he was holding gun." Pt admits to holding a gun PTA and states he has thoughts of killing himself. Pt states he does not want to harm his wife or anyone else. Pt states he recently had a heart attack and he has been dealing with the stress of his medical issues. Pt states while he was in the hospital last week his father passed away and he did not get to say goodbye to him. Pt states his wife told him that she wants a divorce. Pt states he feels that he had a "breakdown" today.   Pt states he does not recall everything that happened because he was angry. Pt states he recalls grabbing the gun and telling police officers that he wants them to shoot him. Pt is tearful during the assessment and states he asked his wife "how could she do that? How could she be so cruel? How could she kick me when I'm already down?" Pt continues to cry and states he "lost everything in a matter of days." Pt states he told his wife that he was going to kill himself and that she could watch. Pt states he feels scared of himself and continues to repeat "I broke down, I think tonight I just broke, something in me broke."  Pt states he experiences command AH and states he has experienced this since he was in high school. Pt states he had a head injury in high school and [facility]. Pt states the AH have gotten louder over the past several weeks. 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 Pt states he only wants to be in the hospital for 72 hours and he wants  to be d/c.   Nira ConnJason Berry, NP recommends inpt tx. TTS to seek placement once medically cleared. EDP Dr. Manus Gunningancour, MD has been advised.   Diagnosis: Bipolar d/o w/ mixed features; Cannabis use d/o  Past Medical History:  Past Medical History:  Diagnosis Date  . Diabetes mellitus without complication (HCC)   . MI (myocardial infarction) (HCC)   . Seizures (HCC)     Past Surgical History:  Procedure Laterality Date  . CARDIAC SURGERY    . CORONARY STENT INTERVENTION N/A 11/23/2018   Procedure: CORONARY STENT INTERVENTION;  Surgeon: Tonny Bollmanooper, Michael, MD;  [facility];  Service: Cardiovascular;  Laterality: N/A;  . LEFT HEART CATH AND CORS/GRAFTS ANGIOGRAPHY N/A 11/23/2018   Procedure: LEFT HEART CATH AND CORS/GRAFTS ANGIOGRAPHY;  Surgeon: Tonny Bollmanooper, Michael, MD;  [facility];  Service: Cardiovascular;  Laterality: N/A;    Family History: History reviewed. No pertinent family history.  Social History:  reports that he has been smoking. He has never used smokeless tobacco. He reports previous alcohol use. He reports previous drug use.  Additional Social History:  Alcohol / Drug Use Pain Medications: See MAR Prescriptions: See MAR Over the Counter: See MAR History of alcohol / drug use?: Yes Substance #1 Name of Substance 1: Alcohol 1 - Age of First Use: 21  1 - Amount (size/oz): varies, current BAL 119 1 - Frequency: socially 1 - Duration: ongoing 1 - Last Use / Amount: 12/17/18 Substance #2 Name of Substance 2: Cannabis 2 - Age of First Use: unknown 2 - Amount (size/oz): unknown 2 - Frequency: unknown 2 - Duration: unknown 2 - Last Use / Amount: pt denies use although labs are positive on arrival  CIWA: CIWA-Ar BP: (!) 129/93 Pulse Rate: (!) 113 COWS:    Allergies:  Allergies  Allergen Reactions  . Morphine And Related Shortness Of Breath and Itching    Home Medications: (Not in a hospital admission)   OB/GYN Status:  No LMP for male  patient.  General Assessment Data Assessment unable to be completed: Yes Reason for not completing assessment: TTS calling cart, no answer Location of Assessment: [facility] ED TTS Assessment: In system Is this a Tele or Face-to-Face Assessment?: Tele Assessment Is this an Initial Assessment or a Re-assessment for this encounter?: Initial Assessment Patient Accompanied by:: N/A Language Other than English: No Living Arrangements: Other (Comment) What gender do you identify as?: Male Marital status: Married Pregnancy Status: No Living Arrangements: Spouse/significant other, Children Can pt return to current living arrangement?: No Admission Status: Involuntary Petitioner: ED Attending Is patient capable of signing voluntary admission?: No Referral Source: Self/Family/Friend Insurance type: MCD     Crisis Care Plan Living Arrangements: Spouse/significant other, Children Name of Psychiatrist: Dr. Darleene Cleaver, MD Name of Therapist: none  Education Status Is patient currently in school?: No Is the patient employed, unemployed or receiving disability?: Employed  Risk to self with the past 6 months Suicidal Ideation: Yes-Currently Present Has patient been a risk to self within the past 6 months prior to admission? : Yes Suicidal Intent: Yes-Currently Present Has patient had any suicidal intent within the past 6 months prior to admission? : Yes Is patient at risk for suicide?: Yes Suicidal Plan?: Yes-Currently Present Has patient had any suicidal plan within the past 6 months prior to admission? : Yes Specify Current Suicidal Plan: pt attempted to shoot himself with a gun Access to Means: Yes Specify Access to Suicidal Means: pt has access to guns What has been your use of drugs/alcohol within the last 12 months?: alcohol, labs positive for cannabis Previous Attempts/Gestures: Yes How many times?: (multiple) Other Self Harm Risks: hx of depression, hx of suicide attempts, recent  loss Triggers for Past Attempts: Other personal contacts Intentional Self Injurious Behavior: None Family Suicide History: No Recent stressful life event(s): Conflict (Comment), Divorce, Loss (Comment), Recent negative physical changes(father passed away, wife wants divorce) Persecutory voices/beliefs?: Yes Depression: Yes Depression Symptoms: Despondent, Insomnia, Tearfulness, Isolating, Fatigue, Feeling worthless/self pity, Loss of interest in usual pleasures, Guilt, Feeling angry/irritable Substance abuse history and/or treatment for substance abuse?: Yes Suicide prevention information given to non-admitted patients: Not applicable  Risk to Others within the past 6 months Homicidal Ideation: Yes-Currently Present(per IVC, pt denies this) Does patient have any lifetime risk of violence toward others beyond the six months prior to admission? : Yes (comment)(per IVC, pt denies) Thoughts of Harm to Others: Yes-Currently Present Comment - Thoughts of Harm to Others: per IVC pt made threats to kill his wife Current Homicidal Intent: Yes-Currently Present Current Homicidal Plan: Yes-Currently Present Describe Current Homicidal Plan: per IVC pt made threats to shoot his wife Access to Homicidal Means: Yes Describe Access to Homicidal Means: pt has access to guns Identified Victim: wife History of harm to others?: No Assessment of Violence: On admission Violent Behavior  Description: pt admits becoming angry after drinking alcohol, states he did not make threats to kill wife but IVC states he threatened to kill her Does patient have access to weapons?: Yes (Comment)(guns) Criminal Charges Pending?: No Does patient have a court date: No Is patient on probation?: No  Psychosis Hallucinations: Auditory, With command Delusions: None noted  Mental Status Report Appearance/Hygiene: Disheveled Eye Contact: Good Motor Activity: Tremors Speech: Logical/coherent Level of Consciousness: Alert,  Crying Mood: Depressed, Despair, Sad, Helpless Affect: Depressed, Sad Anxiety Level: None Thought Processes: Coherent, Relevant Judgement: Impaired Orientation: Person, Place, Time, Situation, Appropriate for developmental age Obsessive Compulsive Thoughts/Behaviors: None  Cognitive Functioning Concentration: Normal Memory: Remote Intact, Recent Intact Is patient IDD: No Insight: Poor Impulse Control: Poor Appetite: Fair Have you had any weight changes? : No Change Sleep: Decreased Total Hours of Sleep: 4 Vegetative Symptoms: None  ADLScreening [facility] Assessment Services) Patient's cognitive ability adequate to safely complete daily activities?: Yes Patient able to express need for assistance with ADLs?: Yes Independently performs ADLs?: Yes (appropriate for developmental age)  Prior Inpatient Therapy Prior Inpatient Therapy: Yes Prior Therapy Dates: unknown Prior Therapy Facilty/Provider(s): unable to recall Reason for Treatment: Bipolar  Prior Outpatient Therapy Prior Outpatient Therapy: Yes Prior Therapy Dates: ongoing Prior Therapy Facilty/Provider(s): Dr. Jannifer FranklinAkintayo, MD Reason for Treatment: med management Does patient have an ACCT team?: No Does patient have Intensive In-House Services?  : No Does patient have [facility] services? : No Does patient have P4CC services?: No  ADL Screening (condition at time of admission) Patient's cognitive ability adequate to safely complete daily activities?: Yes Is the patient deaf or have difficulty hearing?: No Does the patient have difficulty seeing, even when wearing glasses/contacts?: No Does the patient have difficulty concentrating, remembering, or making decisions?: No Patient able to express need for assistance with ADLs?: Yes Does the patient have difficulty dressing or bathing?: No Independently performs ADLs?: Yes (appropriate for developmental age) Does the patient have difficulty walking or climbing stairs?:  No Weakness of Legs: None Weakness of Arms/Hands: None  Home Assistive Devices/Equipment Home Assistive Devices/Equipment: Eyeglasses    Abuse/Neglect Assessment (Assessment to be complete while patient is alone) Abuse/Neglect Assessment Can Be Completed: Yes Physical Abuse: Denies Verbal Abuse: Denies Sexual Abuse: Denies Exploitation of patient/patient's resources: Denies Self-Neglect: Denies     Merchant navy officerAdvance Directives (For Healthcare) Does Patient Have a Medical Advance Directive?: No Would patient like information on creating a medical advance directive?: No - Patient declined          Disposition: Nira ConnJason Berry, NP recommends inpt tx. TTS to seek placement once medically cleared. EDP Dr. Manus Gunningancour, MD has been advised.  Disposition Initial Assessment Completed for this Encounter: Yes Disposition of Patient: Admit Type of inpatient treatment program: Adult Patient refused recommended treatment: No  This service was provided via telemedicine using a 2-way, interactive audio and video technology.  Names of all persons participating in this telemedicine service and their role in this encounter. Name: Kenneth Jones Mcguinn Role: Patient  Name: Princess Bruinsquicha Izzie Geers Role: TTS          Karolee Ohsquicha R Nysha Koplin 12/18/2018 12:30 AM

## 2018-12-18 NOTE — Clinical Social Work Note (Signed)
Made referral for IVC inpatient admission to [facility] at [facility].   Dayton Scrape, [place]

## 2018-12-18 NOTE — Consult Note (Addendum)
Consult Note   Kenneth Jones ZOX:[id number] DOB: 03/14/1971 DOA: 12/17/2018  PCP: Patient, No Pcp Per  Patient coming from: Home  I have personally briefly reviewed patient's old medical records in [facility]  Chief Complaint: SI/HI  HPI: Kenneth Jones is a 48 y.o. male with medical history significant of DM2, seizures, CAD s/p CABG, chronic chest pain ever since CABG for which he takes chronic opiates for, recent Stent placement to [facility] just last month.  Has been taking Brilinta BID and hasnt missed a dose he tells me.  Patient presents to the ED: Per IVC he "threatned to kill self and wife with gun. Multiple guns accessible. Asked police to kill him while he was holding gun." Pt admits to holding a gun PTA and states he has thoughts of killing himself. Pt states he does not want to harm his wife or anyone else. Pt states he recently had a heart attack and he has been dealing with the stress of his medical issues. Pt states while he was in the hospital last week his father passed away and he did not get to say goodbye to him. Pt states his wife told him that she wants a divorce. Pt states he feels that he had a "breakdown" today.  Pt states he experiences command AH and states he has experienced this since he was in high school. Pt states he had a head injury in high school and [facility]. Pt states the AH have gotten louder over the past several weeks. 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 Pt states he only wants to be in the hospital for 72 hours and he wants to be d/c.    ED Course: Patient is IVCd.  High sensitivity trop is negative x2, Initial labs are worrisome for BGL 508 and anion gap metabolic acidosis of 21.  However, this rapidly resolves with just 8u of subq novolog and 1L NS.  And by the time hospitalist is consulted to see the patient his anion gap is closed and BGL now 230 (insulin gtt  never started).  VBG also demonstrates lack of acidosis with PH of 7.39.  Plan is for TTS placement when medically cleared.  Currently patient denies SOB.  Has his usual "bone pain" chronic chest pain that he has had since the CABG.  No additional pain at present time or since arrival to ED though he did have additional pain during the stress period earlier today.   Review of Systems: As per HPI, otherwise all review of systems negative.  Past Medical History:  Diagnosis Date  . Diabetes mellitus without complication (HCC)   . MI (myocardial infarction) (HCC)   . Seizures (HCC)     Past Surgical History:  Procedure Laterality Date  . CARDIAC SURGERY    . CORONARY STENT INTERVENTION N/A 11/23/2018   Procedure: CORONARY STENT INTERVENTION;  Surgeon: Tonny Bollmanooper, Michael, MD;  [facility];  Service: Cardiovascular;  Laterality: N/A;  . LEFT HEART CATH AND CORS/GRAFTS ANGIOGRAPHY N/A 11/23/2018   Procedure: LEFT HEART CATH AND CORS/GRAFTS ANGIOGRAPHY;  Surgeon: Tonny Bollmanooper, Michael, MD;  [facility];  Service: Cardiovascular;  Laterality: N/A;     reports that he has been smoking. He has never used smokeless tobacco. He reports previous alcohol use. He reports previous drug use.  Allergies  Allergen Reactions  . Morphine And Related  Shortness Of Breath and Itching    History reviewed. No pertinent family history. Father died this past month.  Prior to Admission medications   Medication Sig Start Date End Date Taking? Authorizing Provider  acetaminophen (TYLENOL) 325 MG tablet Take 650 mg by mouth every 6 (six) hours as needed for moderate pain.     [provider]  albuterol (PROVENTIL HFA;VENTOLIN HFA) 108 (90 Base) MCG/ACT inhaler Inhale 2 puffs into the lungs every 6 (six) hours as needed for wheezing or shortness of breath. 04/08/18   Zachery DauerGraham, Elysa, NP  albuterol (PROVENTIL) (5 MG/ML) 0.5% nebulizer solution Take 0.5 mLs (2.5 mg total) by  nebulization every 6 (six) hours as needed for wheezing or shortness of breath. 04/12/18   Tilden Fossaees, Elizabeth, MD  aspirin EC 81 MG tablet Take 81 mg by mouth daily.    [provider]  clonazePAM (KLONOPIN) 1 MG tablet Take 1 mg by mouth once as needed.    [provider]  dextromethorphan-guaiFENesin (MUCINEX DM) 30-600 MG 12hr tablet Take 1 tablet by mouth 2 (two) times daily as needed for cough.    [provider]  diclofenac sodium (VOLTAREN) 1 % GEL Apply 2 g topically 4 (four) times daily as needed (pain).     [provider]  empagliflozin (JARDIANCE) 10 MG TABS tablet Take 10 mg by mouth daily.    [provider]  ibuprofen (ADVIL,MOTRIN) 200 MG tablet Take 400 mg by mouth daily as needed for moderate pain.     [provider]  lamoTRIgine (LAMICTAL) 200 MG tablet Take 400 mg by mouth at bedtime.     [provider]  lisinopril (PRINIVIL,ZESTRIL) 5 MG tablet Take 5 mg by mouth daily.    [provider]  Lurasidone HCl (LATUDA) 60 MG TABS Take 1 tablet by mouth at bedtime.    [provider]  metFORMIN (GLUCOPHAGE) 1000 MG tablet Take 1,000 mg by mouth daily.    [provider]  metoprolol tartrate (LOPRESSOR) 25 MG tablet Take 1 tablet (25 mg total) by mouth 2 (two) times daily. 11/23/18   Dunn, Tacey Ruizayna N, PA-C  Multiple Vitamin (MULTIVITAMIN WITH MINERALS) TABS tablet Take 1 tablet by mouth daily.    [provider]  nitroGLYCERIN (NITROSTAT) 0.4 MG SL tablet Place 0.4 mg under the tongue as needed. 02/24/12   [provider]  oxyCODONE (OXYCONTIN) 20 mg 12 hr tablet Take 20 mg by mouth every 12 (twelve) hours.    [provider]  oxyCODONE-acetaminophen (PERCOCET) 7.5-325 MG tablet Take 1 tablet by mouth every 6 (six) hours as needed for severe pain.     [provider]  pseudoephedrine (SUDAFED) 120 MG 12 hr tablet Take 120 mg by mouth daily as needed for congestion.     [provider]  simvastatin (ZOCOR) 80 MG tablet Take 80 mg by mouth daily. 12/12/14   [provider]  tamsulosin (FLOMAX) 0.4 MG CAPS capsule Take 0.4 mg by mouth daily.    [provider]  ticagrelor (BRILINTA) 90 MG TABS tablet Take 1 tablet (90 mg total) by mouth 2 (two) times daily. 11/24/18   Laurann Montanaunn, Dayna N, PA-C    Physical Exam: Vitals:   12/17/18 2330 12/18/18 0045 12/18/18 0100 12/18/18 0103  BP: (!) 129/93 119/72 114/75 114/75  Pulse: (!) 113 92 91 88  Resp: (!) 28 (!) 21 (!) 23   Temp:      TempSrc:      SpO2: 97% 96%  97%     Constitutional: NAD, calm, comfortable Eyes: PERRL, lids and conjunctivae normal ENMT: Mucous membranes are moist. Posterior pharynx clear of any exudate or lesions.Normal dentition.  Neck: normal, supple, no masses, no thyromegaly Respiratory: clear to auscultation bilaterally, no wheezing, no crackles. Normal respiratory effort. No accessory muscle use.  Cardiovascular: Regular rate and rhythm, no murmurs / rubs / gallops. No extremity edema. 2+ pedal pulses. No carotid bruits.  Abdomen: no tenderness, no masses palpated. No hepatosplenomegaly. Bowel sounds positive.  Musculoskeletal: no clubbing / cyanosis. No joint deformity upper and lower extremities. Good ROM, no contractures. Normal muscle tone.  Skin: no rashes, lesions, ulcers. No induration Neurologic: CN 2-12 grossly intact. Sensation intact, DTR normal. Strength 5/5 in all 4.  Psychiatric: Normal judgment and insight. Alert and oriented x 3. Normal mood.    Labs on Admission: I have personally reviewed following labs and imaging studies  CBC: Recent Labs  Lab 12/17/18 1953 12/18/18 0121  WBC 18.4*  --   HGB 17.6* 16.0  HCT 50.2 47.0  MCV 93.3  --   PLT 292  --    Basic Metabolic Panel: Recent Labs  Lab 12/17/18 1953 12/18/18 0003 12/18/18 0121  NA 138 141 140  K 3.4* 4.3 3.5  CL 104 109  --   CO2 13* 19*  --   GLUCOSE 508* 271*  --   BUN 11 8   --   CREATININE 1.10 0.86  --   CALCIUM 9.2 8.8*  --    GFR: CrCl cannot be calculated (Unknown ideal weight.). Liver Function Tests: No results for input(s): AST, ALT, ALKPHOS, BILITOT, PROT, ALBUMIN in the last 168 hours. No results for input(s): LIPASE, AMYLASE in the last 168 hours. No results for input(s): AMMONIA in the last 168 hours. Coagulation Profile: No results for input(s): INR, PROTIME in the last 168 hours. Cardiac Enzymes: No results for input(s): CKTOTAL, CKMB, CKMBINDEX, TROPONINI in the last 168 hours. BNP (last 3 results) No results for input(s): PROBNP in the last 8760 hours. HbA1C: No results for input(s): HGBA1C in the last 72 hours. CBG: Recent Labs  Lab 12/17/18 2209 12/18/18 0134  GLUCAP 397* 239*   Lipid Profile: No results for input(s): CHOL, HDL, LDLCALC, TRIG, CHOLHDL, LDLDIRECT in the last 72 hours. Thyroid Function Tests: No results for input(s): TSH, T4TOTAL, FREET4, T3FREE, THYROIDAB in the last 72 hours. Anemia Panel: No results for input(s): VITAMINB12, FOLATE, FERRITIN, TIBC, IRON, RETICCTPCT in the last 72 hours. Urine analysis:    Component Value Date/Time   COLORURINE YELLOW 11/21/2018 1212   APPEARANCEUR CLEAR 11/21/2018 1212   LABSPEC 1.029 11/21/2018 1212   PHURINE 5.0 11/21/2018 1212   GLUCOSEU >=500 (A) 11/21/2018 1212   HGBUR NEGATIVE 11/21/2018 1212   BILIRUBINUR NEGATIVE 11/21/2018 1212   KETONESUR NEGATIVE 11/21/2018 1212   PROTEINUR NEGATIVE 11/21/2018 1212   NITRITE NEGATIVE 11/21/2018 1212   LEUKOCYTESUR NEGATIVE 11/21/2018 1212    Radiological Exams on Admission: Dg Chest 2 View  Result Date: 12/17/2018 CLINICAL DATA:  Chest pain EXAM: CHEST - 2 VIEW COMPARISON:  November 21, 2018 FINDINGS: Patient is status post prior median sternotomy. The heart size is mildly enlarged. There is no pneumothorax. No large pleural effusion. No acute osseous abnormality. Degenerative changes are noted of the spine and glenohumeral  joints. IMPRESSION: No active cardiopulmonary disease. Electronically Signed   By: Katherine Mantlehristopher  Green M.D.   On: 12/17/2018 20:55    EKG: Independently reviewed.  Assessment/Plan Principal Problem:  Suicide attempt [facility]) Active Problems:   Hx of CABG   CAD (coronary artery disease)   Type 2 diabetes mellitus with hyperlipidemia (HCC)   Chronic pain   Seizures ([place])    1. Suicide attempt - 1. TTS to seek placement 2. Call psych for consult in AM 2. Resolved anion gap metabolic acidosis and hyperglycemia - 1. Does have DM2, but no H/O DKA 2. Not clear if this was DKA or just lactic acidosis due to extreme stress / exertion (see HPI), but im actually leaning somewhat more towards the latter given rapid resolution with just 8u [place] insulin and IVF, no h/o DKA or suggestion of type 1 DM, etc. 3. Thus he is technically medically clear at this point and doesn't require medical admission, none the less, as long as hes in hospital: 1. will hold metformin and Jardiance 2. Put him on Lantus 20u daily, first 10u dose now 3. And mod scale SSI AC for the moment 4. Hospitalist to follow along as long as we are awaiting TTS placement (or if he is placed over at [facility]). 5. Needs PCP follow up 4. Will repeat BMP in AM 3. CAD s/p CABG and Stent last month - 1. Continue ASA and Brilinta 2. Current CP is his chronic CP that he has had since CABG.  Does have stable angina with extreme stress episodes (as in HPI), but at rest just has his usual chronic chest wall pain. 3. Trop neg x2 4. Cont statin 5. Cont BB and ACEi 6. Unless he starts having new or different CP or other symptoms, wouldn't recommend another LHC (less than a month out from last one) to patient, who is here in ED involuntarily anyhow. 4. Chronic CP - 1. Current CP is consistent with his chronic CP that he has had since CABG 2. Continue home oxycontin and percocet 5. Seizure history - 1. Continue home lamictal 6. EtOH on board - has  CIWA with psych holding orders  DVT ppx: Lovenox Code: Full Dispo: to psych when stable Admission status: place in obs     Kashina Mecum, [place] [facility]  How to contact the [facility] Attending or Consulting provider [place] or covering provider during after hours [place], for this patient?  1. Check the care team in [facility] and look for a) attending/consulting [facility] provider listed and b) the [facility] team listed 2. Log into [email or web address]  Amion Physician Scheduling and messaging for groups and whole hospitals  On call and physician scheduling software for group practices, residents, hospitalists and other medical providers for call, clinic, rotation and shift schedules. On[employer] is a hospital-wide system for scheduling doctors and paging doctors on call. EasyPlot is for scientific plotting and data analysis.  [email or web address]  and use [place]'s universal password to access. If you do not have the password, please contact the hospital operator.  3. Locate the [facility] provider you are looking for under [facility] and page to a number that you can be directly reached. 4. If you still have difficulty reaching the provider, please page the [facility] (Director on Call) for the Hospitalists listed on amion for assistance.  12/18/2018, 1:41 AM
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
